# Patient Record
Sex: Male | Born: 1945 | Race: White | Hispanic: No | Smoking: Former smoker
Health system: Southern US, Community
[De-identification: ages and names within clinical notes are randomized; demographics above are authoritative.]

## PROBLEM LIST (undated history)

## (undated) DIAGNOSIS — I10 Essential (primary) hypertension: Secondary | ICD-10-CM

## (undated) DIAGNOSIS — J449 Chronic obstructive pulmonary disease, unspecified: Secondary | ICD-10-CM

## (undated) DIAGNOSIS — Z720 Tobacco use: Secondary | ICD-10-CM

## (undated) DIAGNOSIS — E785 Hyperlipidemia, unspecified: Secondary | ICD-10-CM

## (undated) DIAGNOSIS — T7840XA Allergy, unspecified, initial encounter: Secondary | ICD-10-CM

## (undated) DIAGNOSIS — I714 Abdominal aortic aneurysm, without rupture, unspecified: Secondary | ICD-10-CM

## (undated) HISTORY — DX: Tobacco use: Z72.0

## (undated) HISTORY — DX: Allergy, unspecified, initial encounter: T78.40XA

## (undated) HISTORY — DX: Hyperlipidemia, unspecified: E78.5

## (undated) HISTORY — DX: Chronic obstructive pulmonary disease, unspecified: J44.9

## (undated) HISTORY — PX: ABDOMINAL AORTIC ANEURYSM REPAIR: SUR1152

---
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
## 2011-10-16 HISTORY — PX: ABDOMINAL AORTIC ANEURYSM REPAIR: SHX42

## 2015-10-16 HISTORY — PX: CARDIAC CATHETERIZATION: SHX172

## 2016-10-30 ENCOUNTER — Encounter: Payer: Self-pay | Admitting: Family Medicine

## 2016-10-30 ENCOUNTER — Ambulatory Visit (INDEPENDENT_AMBULATORY_CARE_PROVIDER_SITE_OTHER): Payer: Medicare Other | Admitting: Family Medicine

## 2016-10-30 VITALS — BP 122/64 | HR 86 | Temp 98.1°F | Resp 18 | Ht 69.0 in | Wt 189.1 lb

## 2016-10-30 DIAGNOSIS — Z86010 Personal history of colonic polyps: Secondary | ICD-10-CM | POA: Diagnosis not present

## 2016-10-30 DIAGNOSIS — Z125 Encounter for screening for malignant neoplasm of prostate: Secondary | ICD-10-CM

## 2016-10-30 DIAGNOSIS — Z720 Tobacco use: Secondary | ICD-10-CM | POA: Diagnosis not present

## 2016-10-30 DIAGNOSIS — I714 Abdominal aortic aneurysm, without rupture, unspecified: Secondary | ICD-10-CM

## 2016-10-30 DIAGNOSIS — Z7689 Persons encountering health services in other specified circumstances: Secondary | ICD-10-CM

## 2016-10-30 DIAGNOSIS — E785 Hyperlipidemia, unspecified: Secondary | ICD-10-CM

## 2016-10-30 DIAGNOSIS — Z2821 Immunization not carried out because of patient refusal: Secondary | ICD-10-CM

## 2016-10-30 HISTORY — DX: Tobacco use: Z72.0

## 2016-10-30 NOTE — Progress Notes (Signed)
Chief Complaint  Patient presents with  . Establish Care   Patient is new to establish. Me recently moved here from [place]. Old records are requested. He is an active retired person who feels he is in good health. 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 Likes to be outdoors and exercise. I have discussed the multiple health risks associated with cigarette smoking including, but not limited to, cardiovascular disease, lung disease and cancer.  I have strongly recommended that smoking be stopped.  I have reviewed the various methods of quitting including cold [place], classes, nicotine replacements and prescription medications.  I have offered assistance in this difficult process.  The patient is not interested in assistance at this time. He feels he can quit on his own. He promises he'll be quit by the time he comes back. We specifically discussed the impact cigarette smoking has on vascular disease given his history of aortic aneurysm repair. In [place] he was under the care of cardiology and a vascular surgeon. He states he had a cardiac catheterization within the last 3 months. He thinks it was normal. He states his aorta was checked at the same time and was found to be stable. He was placed on Plavix at that time for unknown reason. I will review his records. Patient is up-to-date with colonoscopy. He gets them every 5 years because of polyps. Next one is due in 2019. He is not having any bleeding or problems with his bowels. He had a tetanus shot 4 years ago. He's had a pneumonia shot. He refuses influenza shots because his wife had a severe reaction to them. He has not had a shingles shot.   Patient Active Problem List   Diagnosis Date Noted  . Tobacco abuse 10/30/2016  . History of colonic polyps 10/30/2016  . AAA (abdominal aortic aneurysm) without rupture ([place]) 10/30/2016  . HLD (hyperlipidemia) 10/30/2016  . Immunization refused 10/30/2016    Outpatient Encounter  Prescriptions as of 10/30/2016  Medication Sig  . aspirin EC 81 MG tablet Take 81 mg by mouth daily.  Marland Kitchen atorvastatin (LIPITOR) 40 MG tablet Take 40 mg by mouth daily.  . cholecalciferol (VITAMIN D) 1000 units tablet Take 5,000 Units by mouth daily.  . clopidogrel (PLAVIX) 75 MG tablet Take 75 mg by mouth daily.   No facility-administered encounter medications on file as of 10/30/2016.     Past Medical History:  Diagnosis Date  . Allergy   . Hyperlipidemia   . Tobacco abuse 10/30/2016    Past Surgical History:  Procedure Laterality Date  . ABDOMINAL AORTIC ANEURYSM REPAIR  2013  . CARDIAC CATHETERIZATION  2017  . TONSILLECTOMY AND ADENOIDECTOMY  1953    Social History   Social History  . Marital status: Married    Spouse name: [name]  . Number of children: 3  . Years of education: 20   Occupational History  . RETIRED     CARPENTER   Social History Main Topics  . Smoking status: Current Every Day Smoker    Packs/day: 1.00    Types: Cigarettes    Start date: 10/15/1966  . Smokeless tobacco: Never Used     Comment: QUITTING  . Alcohol use No  . Drug use: No  . Sexual activity: Not Currently   Other Topics Concern  . Not on file   Social History Narrative   Moved from [place]   Retired Games developer   Lives with [place]    Family History  Problem Relation Age of Onset  . Hypertension Mother   . Cancer Father     MESOTHELIOMA  . Cancer Sister     LUNG  . Cancer Brother     LUNG    Review of Systems  Constitutional: Negative for chills, fever and weight loss.  HENT: Negative for congestion and hearing loss.   Eyes: Negative for blurred vision and pain.  Respiratory: Negative for cough and shortness of breath.   Cardiovascular: Negative for chest pain and leg swelling.  Gastrointestinal: Negative for abdominal pain, constipation, diarrhea and heartburn.  Genitourinary: Negative for dysuria and frequency.  Musculoskeletal: Negative for falls, joint pain and  myalgias.  Neurological: Negative for dizziness, seizures and headaches.  Psychiatric/Behavioral: Negative for depression. The patient is not nervous/anxious and does not have insomnia.     BP 122/64 (BP Location: Right Arm, Patient Position: Sitting, Cuff Size: Normal)   Pulse 86   Temp 98.1 F (36.7 C) (Oral)   Resp 18   Ht 5\' 9"  (1.753 m)   Wt 189 lb 1.9 oz (85.8 kg)   SpO2 95%   BMI 27.93 kg/m   Physical Exam  Constitutional: He is oriented to person, place, and time. He appears well-developed and well-nourished.  Lean, appears in good condition  HENT:  Head: Normocephalic and atraumatic.  Right Ear: External ear normal.  Left Ear: External ear normal.  Nose: Nose normal.  Mouth/Throat: Oropharynx is clear and moist.  Edentulous  Eyes: Conjunctivae and EOM are normal. Pupils are equal, round, and reactive to light.  Neck: Normal range of motion. Neck supple. No thyromegaly present.  Cardiovascular: Normal rate, regular rhythm and normal heart sounds.   Pulmonary/Chest: Effort normal and breath sounds normal. No respiratory distress. He has no wheezes. He has no rales.  Abdominal: Soft. Bowel sounds are normal.  Musculoskeletal: Normal range of motion. He exhibits no edema.  Lymphadenopathy:    He has no cervical adenopathy.  Neurological: He is alert and oriented to person, place, and time.  Gait normal  Skin: Skin is warm and dry.  Psychiatric: He has a normal mood and affect. His behavior is normal. Thought content normal.  Nursing note and vitals reviewed. ASSESSMENT/PLAN:   1. Tobacco abuse Patient is trying to quit  2. History of colonic polyps Colonoscopy due next year  3. AAA (abdominal aortic aneurysm) without rupture ([facility]) Stable  4. Hyperlipidemia, unspecified hyperlipidemia type Stable  5. Encounter to establish care with new doctor   6. Immunization refused    Patient Instructions  Need old records  STOP SMOKING  Need blood work and a  check up in six months       Raylene Everts, MD

## 2016-10-30 NOTE — Patient Instructions (Addendum)
Need old records  STOP SMOKING  Need blood work and a check up in six months

## 2016-11-02 ENCOUNTER — Encounter: Payer: Self-pay | Admitting: Family Medicine

## 2016-11-02 DIAGNOSIS — I2510 Atherosclerotic heart disease of native coronary artery without angina pectoris: Secondary | ICD-10-CM | POA: Insufficient documentation

## 2017-02-21 ENCOUNTER — Other Ambulatory Visit: Payer: Self-pay

## 2017-02-21 MED ORDER — ATORVASTATIN CALCIUM 40 MG PO TABS: 40.0000 mg | ORAL_TABLET | Freq: Every day | ORAL | 3 refills | Status: DC

## 2017-04-29 ENCOUNTER — Encounter: Payer: Medicare Other | Admitting: Family Medicine

## 2017-04-29 ENCOUNTER — Ambulatory Visit: Payer: Medicare Other

## 2017-06-03 ENCOUNTER — Telehealth: Payer: Self-pay

## 2017-06-03 DIAGNOSIS — E785 Hyperlipidemia, unspecified: Secondary | ICD-10-CM | POA: Diagnosis not present

## 2017-06-03 DIAGNOSIS — Z7689 Persons encountering health services in other specified circumstances: Secondary | ICD-10-CM | POA: Diagnosis not present

## 2017-06-03 NOTE — Telephone Encounter (Signed)
Labs ordered.

## 2017-06-04 LAB — LIPID PANEL
CHOL/HDL RATIO: 3.2 ratio (ref <5.0)
Cholesterol: 145 mg/dL (ref <200)
HDL: 45 mg/dL (ref >40)
LDL CALC: 83 mg/dL (ref <100)
Triglycerides: 86 mg/dL (ref <150)
VLDL: 17 mg/dL (ref <30)

## 2017-06-04 LAB — URINALYSIS, ROUTINE W REFLEX MICROSCOPIC
Bilirubin Urine: NEGATIVE
Glucose, UA: NEGATIVE
HGB URINE DIPSTICK: NEGATIVE
KETONES UR: NEGATIVE
Leukocytes, UA: NEGATIVE
NITRITE: NEGATIVE
PH: 5.5 (ref 5.0–8.0)
PROTEIN: NEGATIVE
Specific Gravity, Urine: 1.016 (ref 1.001–1.035)

## 2017-06-04 LAB — COMPLETE METABOLIC PANEL WITH GFR
ALT: 15 U/L (ref 9–46)
AST: 12 U/L (ref 10–35)
Albumin: 4.3 g/dL (ref 3.6–5.1)
Alkaline Phosphatase: 72 U/L (ref 40–115)
BILIRUBIN TOTAL: 1.1 mg/dL (ref 0.2–1.2)
BUN: 16 mg/dL (ref 7–25)
CALCIUM: 9.6 mg/dL (ref 8.6–10.3)
CO2: 22 mmol/L (ref 20–32)
CREATININE: 0.95 mg/dL (ref 0.70–1.18)
Chloride: 104 mmol/L (ref 98–110)
GFR, EST NON AFRICAN AMERICAN: 80 mL/min (ref >=60)
Glucose, Bld: 84 mg/dL (ref 65–99)
Potassium: 5 mmol/L (ref 3.5–5.3)
Sodium: 141 mmol/L (ref 135–146)
TOTAL PROTEIN: 7 g/dL (ref 6.1–8.1)

## 2017-06-04 LAB — VITAMIN D 25 HYDROXY (VIT D DEFICIENCY, FRACTURES): VIT D 25 HYDROXY: 30 ng/mL (ref 30–100)

## 2017-06-04 LAB — HEPATITIS C ANTIBODY: HCV Ab: NONREACTIVE

## 2017-06-04 LAB — PSA: PSA: 2.1 ng/mL (ref <=4.0)

## 2017-06-10 ENCOUNTER — Encounter: Payer: Self-pay | Admitting: Family Medicine

## 2017-06-11 ENCOUNTER — Encounter: Payer: Self-pay | Admitting: Family Medicine

## 2017-06-11 ENCOUNTER — Encounter: Payer: Medicare Other | Admitting: Family Medicine

## 2017-06-11 ENCOUNTER — Ambulatory Visit (INDEPENDENT_AMBULATORY_CARE_PROVIDER_SITE_OTHER): Payer: Medicare Other | Admitting: Family Medicine

## 2017-06-11 VITALS — BP 134/84 | HR 68 | Temp 98.1°F | Resp 18 | Ht 69.0 in | Wt 192.0 lb

## 2017-06-11 DIAGNOSIS — I714 Abdominal aortic aneurysm, without rupture, unspecified: Secondary | ICD-10-CM

## 2017-06-11 DIAGNOSIS — Z720 Tobacco use: Secondary | ICD-10-CM | POA: Diagnosis not present

## 2017-06-11 DIAGNOSIS — E785 Hyperlipidemia, unspecified: Secondary | ICD-10-CM

## 2017-06-11 DIAGNOSIS — I2510 Atherosclerotic heart disease of native coronary artery without angina pectoris: Secondary | ICD-10-CM

## 2017-06-11 MED ORDER — VARENICLINE TARTRATE 1 MG PO TABS: ORAL_TABLET | ORAL | 3 refills | Status: DC

## 2017-06-11 NOTE — Progress Notes (Signed)
Chief Complaint  Patient presents with  . Annual Exam  feels well Has more dyspnea over time, no chest pain or pressure We discussed his smoking Seriously needs to quit Unable on his own.  Discussed methods of quitting, he chooses chantix Labs reviewed, they are all normal or as expected Diet is good, he exercises and walks regularly  Patient Active Problem List   Diagnosis Date Noted  . CAD in native artery 11/02/2016  . Tobacco abuse 10/30/2016  . History of colonic polyps 10/30/2016  . AAA (abdominal aortic aneurysm) without rupture ([place]) 10/30/2016  . Hyperlipidemia 10/30/2016  . Immunization refused 10/30/2016    Outpatient Encounter Prescriptions as of 06/11/2017  Medication Sig  . aspirin EC 81 MG tablet Take 81 mg by mouth daily.  Marland Kitchen atorvastatin (LIPITOR) 40 MG tablet Take 1 tablet (40 mg total) by mouth daily.  . cholecalciferol (VITAMIN D) 1000 units tablet Take 5,000 Units by mouth daily.  . clopidogrel (PLAVIX) 75 MG tablet Take 75 mg by mouth daily.  . varenicline (CHANTIX CONTINUING MONTH PAK) 1 MG tablet One tablet by mouth once to twice a day   No facility-administered encounter medications on file as of 06/11/2017.     Allergies  Allergen Reactions  . Iodine Hives    Review of Systems  Constitutional: Negative for activity change and appetite change.  HENT: Negative for congestion, dental problem and hearing loss.   Eyes: Negative for photophobia and visual disturbance.  Respiratory: Positive for shortness of breath. Negative for wheezing.   Cardiovascular: Negative for chest pain, palpitations and leg swelling.  Gastrointestinal: Negative for constipation and diarrhea.  Genitourinary: Negative for difficulty urinating and frequency.  Musculoskeletal: Negative for arthralgias, back pain and gait problem.  Neurological: Negative for dizziness and headaches.  Psychiatric/Behavioral: Negative for decreased concentration and sleep disturbance.   Vivid dreams    BP 134/84 (BP Location: Left Arm, Patient Position: Sitting, Cuff Size: Normal)   Pulse 68   Temp 98.1 F (36.7 C) (Temporal)   Resp 18   Ht 5\' 9"  (1.753 m)   Wt 192 lb (87.1 kg)   SpO2 94%   BMI 28.35 kg/m   Physical Exam BP 134/84 (BP Location: Left Arm, Patient Position: Sitting, Cuff Size: Normal)   Pulse 68   Temp 98.1 F (36.7 C) (Temporal)   Resp 18   Ht 5\' 9"  (1.753 m)   Wt 192 lb (87.1 kg)   SpO2 94%   BMI 28.35 kg/m   General Appearance:    Alert, cooperative, no distress, appears stated age  Head:    Normocephalic, without obvious abnormality, atraumatic  Eyes:    PERRL, conjunctiva/corneas clear, EOM's intact, fundi    benign, both eyes       Ears:    Normal TM's and external ear canals, both ears  Nose:   Nares normal, septum midline, mucosa normal, no drainage   or sinus tenderness  Throat:   Lips, mucosa, and tongue normal; teeth well restored,and gums normal  Neck:   Supple, symmetrical, trachea midline, no adenopathy;       thyroid:  No enlargement/tenderness/nodules; no carotid   bruit or JVD  Back:     Symmetric, no curvature, ROM normal, no CVA tenderness  Lungs:     Clear to auscultation bilaterally, respirations unlabored  Chest wall:    No tenderness or deformity  Heart:    Regular rate and rhythm, S1 and S2 normal, no murmur, rub  or gallop  Abdomen:     Soft, non-tender, bowel sounds active all four quadrants,    no masses, no organomegaly  Genitalia:    Normal male without lesion, discharge or tenderness  Extremities:   Extremities normal, atraumatic, no cyanosis or edema  Pulses:   2+ and symmetric all extremities  Skin:   Skin color, texture, turgor normal, no rashes or lesions  Lymph nodes:   Cervical, supraclavicular, and axillary nodes normal  Neurologic:   Normal strength, sensation and reflexes      throughout    ASSESSMENT/PLAN:  1. AAA (abdominal aortic aneurysm) without rupture [facility]) Due for surveillance -  Ambulatory referral to Cardiology  2. CAD in native artery asymptomatic - Ambulatory referral to Cardiology  3. Tobacco abuse Long discussion.  Agrees to try chantix.  Risks, benefits and side effects discussed.  4. Hyperlipidemia, unspecified hyperlipidemia type Controlled .  LDL 64 Greater than 50% of this visit was spent in counseling and coordinating care.  Total face to face time:  40 min.  Healthy lifestyle and smoking discussion   Patient Instructions  I am glad you want to quit smoking Take the chantix as directed  I have referred to cardiology  Lab tests were all good  See me yearly Call sooner for problems   Raylene Everts, MD

## 2017-06-11 NOTE — Patient Instructions (Signed)
I am glad you want to quit smoking Take the chantix as directed  I have referred to cardiology  Lab tests were all good  See me yearly Call sooner for problems

## 2017-07-12 ENCOUNTER — Ambulatory Visit (INDEPENDENT_AMBULATORY_CARE_PROVIDER_SITE_OTHER): Payer: Medicare Other | Admitting: Cardiovascular Disease

## 2017-07-12 ENCOUNTER — Encounter: Payer: Self-pay | Admitting: Cardiovascular Disease

## 2017-07-12 VITALS — BP 140/80 | HR 50 | Ht 69.0 in | Wt 193.0 lb

## 2017-07-12 DIAGNOSIS — E785 Hyperlipidemia, unspecified: Secondary | ICD-10-CM | POA: Diagnosis not present

## 2017-07-12 DIAGNOSIS — Z95828 Presence of other vascular implants and grafts: Secondary | ICD-10-CM | POA: Diagnosis not present

## 2017-07-12 DIAGNOSIS — Z720 Tobacco use: Secondary | ICD-10-CM | POA: Diagnosis not present

## 2017-07-12 DIAGNOSIS — I2510 Atherosclerotic heart disease of native coronary artery without angina pectoris: Secondary | ICD-10-CM

## 2017-07-12 NOTE — Patient Instructions (Signed)
Your physician wants you to follow-up in: 6 months with Dr Koneswaran You will receive a reminder letter in the mail two months in advance. If you don't receive a letter, please call our office to schedule the follow-up appointment.    Your physician recommends that you continue on your current medications as directed. Please refer to the Current Medication list given to you today.      If you need a refill on your cardiac medications before your next appointment, please call your pharmacy.      Thank you for choosing [facility] !        

## 2017-07-12 NOTE — Progress Notes (Signed)
CARDIOLOGY CONSULT NOTE  Patient ID: David Rodriguez MRN: [id number] DOB/AGE: March 25, 1946 71 y.o.  Admit date: (Not on file) Primary Physician: Raylene Everts, MD Referring Physician: Dr. Meda Coffee  Reason for Consultation: CAD, AAA  HPI: David Rodriguez is a 71 y.o. male who is being seen today for the evaluation of Coronary artery disease and abdominal aortic aneurysm at the request of Raylene Everts, MD.   He recently saw his PCP, Dr. Meda Coffee, on 06/11/17. I reviewed records, labs, studies.  He reportedly underwent coronary angiography in [place] in 2017. He reportedly also saw vascular surgeon. He has a history of hyperlipidemia and tobacco abuse.  He underwent a nuclear stress test on 10/13/15 in [place], [place], which demonstrated a large inferior wall defect with minimal redistribution, LVEF 58%.  He tells me he underwent coronary angiography in 2017 and does not have any coronary artery disease. This was performed by Dr. Donneta Romberg.  He has a history of an abdominal aortic aneurysm and underwent stent placement by a vascular surgeon by the name of Dr. Nicoletta Ba in [place], [place].  He denies chest pain. He has exertional dyspnea which he attributes to long-standing tobacco use. He smokes a pack of cigarettes daily and has done so for several decades. When he stops smoking for 3 days he felt much better. He just received Chantix in the mail.   He denies palpitations and leg swelling. He also denies claudication pain.  ECG performed in the office today which I ordered and personally interpreted demonstrated normal sinus rhythm with no significant ST segment or T-wave abnormalities, nor any arrhythmias.  Soc Hx: Originally from [place], [place]. He is a retired Games developer. He moved from [place] to [place] and lived there for 24 years and then moved to [place]. He was divorced and has been remarried for 34 years. His wife's daughter is Dolan Amen who lives  in [place]. She has had CVA's.     Allergies  Allergen Reactions  . Iodine Hives    Current Outpatient Prescriptions  Medication Sig Dispense Refill  . aspirin EC 81 MG tablet Take 81 mg by mouth daily.    Marland Kitchen atorvastatin (LIPITOR) 40 MG tablet Take 1 tablet (40 mg total) by mouth daily. 90 tablet 3  . cholecalciferol (VITAMIN D) 1000 units tablet Take 5,000 Units by mouth daily.    . varenicline (CHANTIX CONTINUING MONTH PAK) 1 MG tablet One tablet by mouth once to twice a day 60 tablet 3   No current facility-administered medications for this visit.     Past Medical History:  Diagnosis Date  . Allergy   . Hyperlipidemia   . Tobacco abuse 10/30/2016    Past Surgical History:  Procedure Laterality Date  . ABDOMINAL AORTIC ANEURYSM REPAIR  2013  . CARDIAC CATHETERIZATION  2017  . TONSILLECTOMY AND ADENOIDECTOMY  1953    Social History   Social History  . Marital status: Married    Spouse name: [name]  . Number of children: 3  . Years of education: 87   Occupational History  . RETIRED     CARPENTER   Social History Main Topics  . Smoking status: Current Every Day Smoker    Packs/day: 1.00    Types: Cigarettes    Start date: 10/15/1966  . Smokeless tobacco: Never Used     Comment: QUITTING  . Alcohol use No  . Drug use: No  . Sexual activity: Not Currently   Other  Topics Concern  . Not on file   Social History Narrative   Moved from [place]   Retired Games developer   Lives with [place] Cl[place]     No family history of premature CAD in 1st degree relatives.  Current Meds  Medication Sig  . aspirin EC 81 MG tablet Take 81 mg by mouth daily.  Marland Kitchen atorvastatin (LIPITOR) 40 MG tablet Take 1 tablet (40 mg total) by mouth daily.  . cholecalciferol (VITAMIN D) 1000 units tablet Take 5,000 Units by mouth daily.  . varenicline (CHANTIX CONTINUING MONTH PAK) 1 MG tablet One tablet by mouth once to twice a day      Review of systems complete and found to be negative unless  listed above in HPI    Physical exam Blood pressure 140/80, pulse (!) 50, height 5\' 9"  (1.753 m), weight 193 lb (87.5 kg), SpO2 95 %. General: NAD Neck: No JVD, no thyromegaly or thyroid nodule.  Lungs: Diminished throughout, no crackles or wheezes. CV: Nondisplaced PMI. Bradycardic, regular rhythm, normal S1/S2, no S3/S4, no murmur.  No peripheral edema.  No carotid bruit.    Abdomen: Soft, nontender, no distention.  Skin: Intact without lesions or rashes.  Neurologic: Alert and oriented x 3.  Psych: Normal affect. Extremities: No clubbing or cyanosis.  HEENT: Normal.   ECG: Most recent ECG reviewed.   Labs: Lab Results  Component Value Date/Time   K 5.0 06/03/2017 08:04 AM   BUN 16 06/03/2017 08:04 AM   CREATININE 0.95 06/03/2017 08:04 AM   ALT 15 06/03/2017 08:04 AM     Lipids: Lab Results  Component Value Date/Time   LDLCALC 83 06/03/2017 08:04 AM   CHOL 145 06/03/2017 08:04 AM   TRIG 86 06/03/2017 08:04 AM   HDL 45 06/03/2017 08:04 AM        ASSESSMENT AND PLAN:  1. Coronary artery disease: He denies having any obstructive lesions by cath. I will try and obtain coronary angiography records from [place].  2. Abdominal aortic aneurysm s/p stent: I will obtain records from his vascular surgeon in [place] and then determine the best course for surveillance imaging.  3. Hyperlipidemia: Lipids reviewed above which were performed on 06/03/17. Continue Lipitor 40 mg.  4. Tobacco abuse: Has been prescribed Chantix by PCP.     Disposition: Follow up in 6 months   Signed: Kate Sable, M.D., F.A.C.C.  07/12/2017, 1:34 PM

## 2017-09-09 ENCOUNTER — Encounter: Payer: Self-pay | Admitting: Family Medicine

## 2017-09-09 ENCOUNTER — Telehealth: Payer: Self-pay | Admitting: *Deleted

## 2017-09-09 ENCOUNTER — Other Ambulatory Visit: Payer: Self-pay

## 2017-09-09 ENCOUNTER — Ambulatory Visit: Payer: Medicare Other | Admitting: Family Medicine

## 2017-09-09 VITALS — BP 152/90 | HR 88 | Temp 98.6°F | Resp 20 | Ht 69.0 in | Wt 186.0 lb

## 2017-09-09 DIAGNOSIS — J209 Acute bronchitis, unspecified: Secondary | ICD-10-CM | POA: Diagnosis not present

## 2017-09-09 DIAGNOSIS — J181 Lobar pneumonia, unspecified organism: Secondary | ICD-10-CM | POA: Diagnosis not present

## 2017-09-09 DIAGNOSIS — J440 Chronic obstructive pulmonary disease with acute lower respiratory infection: Secondary | ICD-10-CM

## 2017-09-09 DIAGNOSIS — J189 Pneumonia, unspecified organism: Secondary | ICD-10-CM

## 2017-09-09 MED ORDER — BENZONATATE 100 MG PO CAPS: 100.0000 mg | ORAL_CAPSULE | Freq: Two times a day (BID) | ORAL | 0 refills | Status: DC | PRN

## 2017-09-09 MED ORDER — AZITHROMYCIN 250 MG PO TABS: ORAL_TABLET | ORAL | 0 refills | Status: DC

## 2017-09-09 NOTE — Progress Notes (Signed)
Chief Complaint  Patient presents with  . URI    x 1 week  Patient is here for an acute visit.  He called this morning with shortness of breath, cough, feeling sick and needing to be seen today.  He states he has been having cough, shortness of breath, sputum, fatigue, chills for about 2 weeks.  His wife has viral bronchitis.  He thought was viral bronchitis at first.  He starting to worry about increased purulent sputum.  He has underlying COPD.  He continues to smoke.  He is worried about pneumonia.  He is using inhalers.  No dizziness.  No chest pain.  Patient Active Problem List   Diagnosis Date Noted  . CAD in native artery 11/02/2016  . Tobacco abuse 10/30/2016  . History of colonic polyps 10/30/2016  . AAA (abdominal aortic aneurysm) without rupture ([place]) 10/30/2016  . Hyperlipidemia 10/30/2016  . Immunization refused 10/30/2016    Outpatient Encounter Medications as of 09/09/2017  Medication Sig  . aspirin EC 81 MG tablet Take 81 mg by mouth daily.  Marland Kitchen atorvastatin (LIPITOR) 40 MG tablet Take 1 tablet (40 mg total) by mouth daily.  . cholecalciferol (VITAMIN D) 1000 units tablet Take 5,000 Units by mouth daily.  Marland Kitchen azithromycin (ZITHROMAX) 250 MG tablet Take as directed  . benzonatate (TESSALON) 100 MG capsule Take 1 capsule (100 mg total) by mouth 2 (two) times daily as needed for cough.  . [DISCONTINUED] varenicline (CHANTIX CONTINUING MONTH PAK) 1 MG tablet One tablet by mouth once to twice a day (Patient not taking: Reported on 09/09/2017)   No facility-administered encounter medications on file as of 09/09/2017.     Allergies  Allergen Reactions  . Iodine Hives    Review of Systems  Constitutional: Positive for chills and fatigue. Negative for activity change and appetite change.  HENT: Negative for congestion, dental problem and hearing loss.   Eyes: Negative for photophobia and visual disturbance.  Respiratory: Positive for cough and shortness of breath.  Negative for wheezing.   Cardiovascular: Negative for chest pain, palpitations and leg swelling.  Gastrointestinal: Negative for constipation and diarrhea.  Genitourinary: Negative for difficulty urinating and frequency.  Musculoskeletal: Negative for arthralgias, back pain and gait problem.  Neurological: Negative for dizziness and headaches.  Psychiatric/Behavioral: Negative for decreased concentration and sleep disturbance.    BP (!) 152/90 (BP Location: Left Arm, Patient Position: Sitting, Cuff Size: Normal)   Pulse 88   Temp 98.6 F (37 C) (Temporal)   Resp 20   Ht 5\' 9"  (1.753 m)   Wt 186 lb 0.6 oz (84.4 kg)   SpO2 90%   BMI 27.47 kg/m   Physical Exam  Constitutional: He is oriented to person, place, and time. He appears well-developed and well-nourished.  Appears tired  HENT:  Head: Normocephalic and atraumatic.  Right Ear: External ear normal.  Left Ear: External ear normal.  Mouth/Throat: Oropharynx is clear and moist.  Eyes: Conjunctivae are normal. Pupils are equal, round, and reactive to light.  Neck: Normal range of motion. Neck supple. No thyromegaly present.  Cardiovascular: Normal rate, regular rhythm and normal heart sounds.  Pulmonary/Chest: Effort normal. No respiratory distress. He has wheezes. He has rales.  Few scattered wheeze.  Rales at both bases.  Abdominal: Soft. Bowel sounds are normal.  Musculoskeletal: Normal range of motion. He exhibits no edema.  Lymphadenopathy:    He has no cervical adenopathy.  Neurological: He is alert and oriented to person, place, and  time.  Gait normal  Skin: Skin is warm and dry.  Psychiatric: He has a normal mood and affect. His behavior is normal. Thought content normal.  Nursing note and vitals reviewed.   ASSESSMENT/PLAN:  1. Acute bronchitis with COPD ([place]) Likely has viral bronchitis from his wife.  Symptomatic care for bronchitis was discussed.  I am concerned with his underlying COPD, worsening symptoms,  and rales that he might have some early pneumonia.  I am going to cover him with a Z-Pak.  2. Community acquired pneumonia of right lower lobe of lung [facility])    Patient Instructions  [facility] Take the antibiotic as directed Take the cough medicine 2-3 times a day (Tessalon pill) In addition take Delsym long-acting cough syrup (over-the-counter) 1 teaspoon morning and night Push fluids Rest Call if not feeling better by next week   Raylene Everts, MD

## 2017-09-09 NOTE — Patient Instructions (Signed)
Your prescriptions were [facility] Take the antibiotic as directed Take the cough medicine 2-3 times a day (Tessalon pill) In addition take Delsym long-acting cough syrup (over-the-counter) 1 teaspoon morning and night Push fluids Rest Call if not feeling better by next week

## 2017-09-09 NOTE — Telephone Encounter (Signed)
Patient is aware to be here today at 12:45

## 2017-09-09 NOTE — Telephone Encounter (Signed)
We can work him in this afternoon, make him aware he may have to wait.

## 2017-09-09 NOTE — Telephone Encounter (Signed)
Patient's wife called stating patient has been sick for 2 weeks with a bad cough and coughing up a lot of fleam. When can this patient be scheduled? Patient wants to be seen today. Please advise [phone]

## 2017-09-18 ENCOUNTER — Encounter: Payer: Self-pay | Admitting: Family Medicine

## 2017-10-23 DIAGNOSIS — H52203 Unspecified astigmatism, bilateral: Secondary | ICD-10-CM | POA: Diagnosis not present

## 2017-10-23 DIAGNOSIS — H5203 Hypermetropia, bilateral: Secondary | ICD-10-CM | POA: Diagnosis not present

## 2017-10-23 DIAGNOSIS — H524 Presbyopia: Secondary | ICD-10-CM | POA: Diagnosis not present

## 2017-10-23 DIAGNOSIS — H2513 Age-related nuclear cataract, bilateral: Secondary | ICD-10-CM | POA: Diagnosis not present

## 2017-11-04 DIAGNOSIS — Z1283 Encounter for screening for malignant neoplasm of skin: Secondary | ICD-10-CM | POA: Diagnosis not present

## 2017-11-04 DIAGNOSIS — D225 Melanocytic nevi of trunk: Secondary | ICD-10-CM | POA: Diagnosis not present

## 2017-12-13 ENCOUNTER — Ambulatory Visit (INDEPENDENT_AMBULATORY_CARE_PROVIDER_SITE_OTHER): Payer: Medicare Other | Admitting: Family Medicine

## 2017-12-13 ENCOUNTER — Other Ambulatory Visit: Payer: Self-pay

## 2017-12-13 ENCOUNTER — Ambulatory Visit (HOSPITAL_COMMUNITY)
Admission: RE | Admit: 2017-12-13 | Discharge: 2017-12-13 | Disposition: A | Discharge status: Home / Self Care | Payer: Medicare Other | Source: Ambulatory Visit | Attending: Family Medicine | Admitting: Family Medicine

## 2017-12-13 ENCOUNTER — Encounter: Payer: Self-pay | Admitting: Family Medicine

## 2017-12-13 VITALS — BP 128/60 | HR 77 | Temp 98.9°F | Resp 16 | Ht 69.0 in | Wt 205.0 lb

## 2017-12-13 DIAGNOSIS — R9389 Abnormal findings on diagnostic imaging of other specified body structures: Secondary | ICD-10-CM

## 2017-12-13 DIAGNOSIS — R0609 Other forms of dyspnea: Secondary | ICD-10-CM | POA: Insufficient documentation

## 2017-12-13 DIAGNOSIS — R918 Other nonspecific abnormal finding of lung field: Secondary | ICD-10-CM | POA: Diagnosis not present

## 2017-12-13 DIAGNOSIS — I2510 Atherosclerotic heart disease of native coronary artery without angina pectoris: Secondary | ICD-10-CM

## 2017-12-13 DIAGNOSIS — J189 Pneumonia, unspecified organism: Secondary | ICD-10-CM | POA: Diagnosis not present

## 2017-12-13 LAB — CBC WITH DIFFERENTIAL/PLATELET
BASOS PCT: 0.7 %
Basophils Absolute: 50 cells/uL (ref 0–200)
EOS PCT: 1.5 %
Eosinophils Absolute: 107 cells/uL (ref 15–500)
HCT: 46.6 % (ref 38.5–50.0)
Hemoglobin: 15.7 g/dL (ref 13.2–17.1)
Lymphs Abs: 1342 cells/uL (ref 850–3900)
MCH: 29.8 pg (ref 27.0–33.0)
MCHC: 33.7 g/dL (ref 32.0–36.0)
MCV: 88.4 fL (ref 80.0–100.0)
MONOS PCT: 11.3 %
MPV: 8.7 fL (ref 7.5–12.5)
Neutro Abs: 4800 cells/uL (ref 1500–7800)
Neutrophils Relative %: 67.6 %
PLATELETS: 276 10*3/uL (ref 140–400)
RBC: 5.27 10*6/uL (ref 4.20–5.80)
RDW: 12.7 % (ref 11.0–15.0)
TOTAL LYMPHOCYTE: 18.9 %
WBC mixed population: 802 cells/uL (ref 200–950)
WBC: 7.1 10*3/uL (ref 3.8–10.8)

## 2017-12-13 LAB — COMPLETE METABOLIC PANEL WITH GFR
AG Ratio: 1.4 (calc) (ref 1.0–2.5)
ALT: 14 U/L (ref 9–46)
AST: 12 U/L (ref 10–35)
Albumin: 4 g/dL (ref 3.6–5.1)
Alkaline phosphatase (APISO): 77 U/L (ref 40–115)
BUN: 16 mg/dL (ref 7–25)
CHLORIDE: 105 mmol/L (ref 98–110)
CO2: 31 mmol/L (ref 20–32)
Calcium: 9.1 mg/dL (ref 8.6–10.3)
Creat: 0.98 mg/dL (ref 0.70–1.18)
GFR, EST AFRICAN AMERICAN: 88 mL/min/{1.73_m2} (ref >=60)
GFR, EST NON AFRICAN AMERICAN: 76 mL/min/{1.73_m2} (ref >=60)
GLUCOSE: 87 mg/dL (ref 65–139)
Globulin: 2.8 g/dL (calc) (ref 1.9–3.7)
Potassium: 4.7 mmol/L (ref 3.5–5.3)
Sodium: 141 mmol/L (ref 135–146)
TOTAL PROTEIN: 6.8 g/dL (ref 6.1–8.1)
Total Bilirubin: 0.7 mg/dL (ref 0.2–1.2)

## 2017-12-13 LAB — BRAIN NATRIURETIC PEPTIDE: BRAIN NATRIURETIC PEPTIDE: 15 pg/mL (ref <100)

## 2017-12-13 MED ORDER — PREDNISONE 20 MG PO TABS: 20.0000 mg | ORAL_TABLET | Freq: Two times a day (BID) | ORAL | 0 refills | Status: DC

## 2017-12-13 MED ORDER — LEVOFLOXACIN 500 MG PO TABS: 500.0000 mg | ORAL_TABLET | Freq: Every day | ORAL | 0 refills | Status: DC

## 2017-12-13 NOTE — Patient Instructions (Signed)
Chest x ray now Blood work now Leave me a number to reach you later

## 2017-12-13 NOTE — Progress Notes (Signed)
Chief Complaint  Patient presents with  . Breathing Problem    patient states this has been going for several months, has gotten worse over last few months.  here for dyspnea.  More and more over a couple of months.  He never recovered from the COPD/CAD episode I treated him for in November. He felt initially much better, and thought he was well, but was slow to get his "wind back" .  He STOPPED SMOKING, and is congratulated.  He is very short of breath even going up one flight of stairs.  He tried his wife's albuterol and it did not help. He coughs in the morning, not as much as he used to.  Very little sputum.  No fatigue or fever or chills.  Appetite and weight stable. He has gotten a feeling of an elephant on his chest a couple of times when in bed - not with exertion.  Doesn't feel like angina.   Patient Active Problem List   Diagnosis Date Noted  . CAD in native artery 11/02/2016  . Tobacco abuse 10/30/2016  . History of colonic polyps 10/30/2016  . AAA (abdominal aortic aneurysm) without rupture ([place]) 10/30/2016  . Hyperlipidemia 10/30/2016  . Immunization refused 10/30/2016    Outpatient Encounter Medications as of 12/13/2017  Medication Sig  . aspirin EC 81 MG tablet Take 81 mg by mouth daily.  Marland Kitchen atorvastatin (LIPITOR) 40 MG tablet Take 1 tablet (40 mg total) by mouth daily.  . cholecalciferol (VITAMIN D) 1000 units tablet Take 5,000 Units by mouth daily.  Marland Kitchen levofloxacin (LEVAQUIN) 500 MG tablet Take 1 tablet (500 mg total) by mouth daily.  . predniSONE (DELTASONE) 20 MG tablet Take 1 tablet (20 mg total) by mouth 2 (two) times daily with a meal.  . [DISCONTINUED] azithromycin (ZITHROMAX) 250 MG tablet Take as directed (Patient not taking: Reported on 12/13/2017)  . [DISCONTINUED] benzonatate (TESSALON) 100 MG capsule Take 1 capsule (100 mg total) by mouth 2 (two) times daily as needed for cough. (Patient not taking: Reported on 12/13/2017)   No facility-administered encounter  medications on file as of 12/13/2017.     Allergies  Allergen Reactions  . Iodine Hives    Review of Systems  Constitutional: Negative for activity change, appetite change, chills, fatigue, fever and unexpected weight change.  HENT: Negative for congestion, postnasal drip and rhinorrhea.   Eyes: Negative for redness and visual disturbance.  Respiratory: Positive for cough, chest tightness and shortness of breath.   Cardiovascular: Negative for chest pain and palpitations.  Gastrointestinal: Negative for abdominal distention and abdominal pain.  Genitourinary: Negative for dysuria and frequency.  Musculoskeletal: Negative for arthralgias and myalgias.  Neurological: Negative for light-headedness and headaches.  Psychiatric/Behavioral: Positive for sleep disturbance. The patient is not nervous/anxious.     BP 128/60 (BP Location: Left Arm, Patient Position: Sitting, Cuff Size: Normal)   Pulse 77   Temp 98.9 F (37.2 C) (Temporal)   Resp 16   Ht 5\' 9"  (1.753 m)   Wt 205 lb (93 kg)   SpO2 92%   BMI 30.27 kg/m   Physical Exam  Constitutional: He is oriented to person, place, and time. He appears well-developed and well-nourished.  Very pleasant and good historian  HENT:  Head: Normocephalic and atraumatic.  Right Ear: External ear normal.  Left Ear: External ear normal.  Mouth/Throat: Oropharynx is clear and moist.  moist  Eyes: Conjunctivae are normal. Pupils are equal, round, and reactive to light.  Neck:  Normal range of motion. Neck supple. No JVD present.  Cardiovascular: Normal rate, regular rhythm and normal heart sounds.  EGK normal  Pulmonary/Chest: Effort normal. He has rales.  Bibasilar rales 1/3 way up chest  Abdominal: Soft. There is no tenderness.  Musculoskeletal: Normal range of motion. He exhibits no edema.  No edema  Neurological: He is alert and oriented to person, place, and time.  Psychiatric: He has a normal mood and affect. His behavior is normal.     ASSESSMENT/PLAN:  1. CAD in native artery - EKG 12-Lead - B Nat Peptide   2. Dyspnea on exertion - DG Chest 2 View; Future  - COMPLETE METABOLIC PANEL WITH GFR - CBC with Differential/Platelet - B Nat Peptide - CBC with Differential - COMPLETE METABOLIC PANEL WITH GFR - Ambulatory referral to Pulmonology  3. Abnormal chest x-ray Density in the LLL that could represent pneumonia,but has a normal white count and no fever or sputum.  He was treated with azithromycin in November with incomplete resolution of Sx.  I will treat with 7 d of Levaquin and a burst of prednisone for the COPD.  Will see next week.  Will refer to pulmonary ASAP.  Have underlying concern for another process, such as malignancy, and this will require close follow up. - Ambulatory referral to Pulmonology  Patient was called at home with above information.  Patient Instructions  Chest x ray now Blood work now Leave me a number to reach you later   Raylene Everts, MD

## 2017-12-16 ENCOUNTER — Telehealth: Payer: Self-pay | Admitting: Family Medicine

## 2017-12-16 NOTE — Telephone Encounter (Signed)
Placed a call this morning to patient to see how he is feeling.  He is much improved on the Levaquin and prednisone.  Will follow

## 2017-12-25 ENCOUNTER — Ambulatory Visit: Payer: Medicare Other | Admitting: Internal Medicine

## 2017-12-25 ENCOUNTER — Encounter: Payer: Self-pay | Admitting: Internal Medicine

## 2017-12-25 VITALS — BP 128/72 | HR 81 | Ht 69.0 in | Wt 200.0 lb

## 2017-12-25 DIAGNOSIS — J441 Chronic obstructive pulmonary disease with (acute) exacerbation: Secondary | ICD-10-CM | POA: Insufficient documentation

## 2017-12-25 DIAGNOSIS — J449 Chronic obstructive pulmonary disease, unspecified: Secondary | ICD-10-CM

## 2017-12-25 DIAGNOSIS — R918 Other nonspecific abnormal finding of lung field: Secondary | ICD-10-CM | POA: Diagnosis not present

## 2017-12-25 HISTORY — DX: Chronic obstructive pulmonary disease, unspecified: J44.9

## 2017-12-25 MED ORDER — UMECLIDINIUM-VILANTEROL 62.5-25 MCG/INH IN AEPB: 1.0000 | INHALATION_SPRAY | Freq: Every day | RESPIRATORY_TRACT | 0 refills | Status: DC

## 2017-12-25 MED ORDER — UMECLIDINIUM-VILANTEROL 62.5-25 MCG/INH IN AEPB: 1.0000 | INHALATION_SPRAY | Freq: Every day | RESPIRATORY_TRACT | 11 refills | Status: DC

## 2017-12-25 NOTE — Progress Notes (Signed)
Subjective:     Patient ID: David Rodriguez, male   DOB: 08-24-1946,   MRN: [id number]  HPI  34 yowm with h/o IHD quit smoking  08/2017 with onset doe x xmas 2017  while in [place] > moved to [place] of 11/2016 due to daughter there and since then sob  worse s assoc cough but assoc with chest tightness "when over does it" so referred to cardiology (Dr Raliegh Ip) and to [facility] clinic 12/25/2017 by Dr Blanchie Serve    12/25/2017 1st [facility] office visit/ Abbi Mancini   Chief Complaint  Patient presents with  . Pulmonary Consult    Referred by Dr. Meda Coffee. Pt c/o DOE x 6 months- gets winded walking up an incline. He also has some non prod cough, tends to be worse in the am's.   cough is less since stopped smoking esp the am cough but breathing is worse  Doe MMRC1 = can walk nl pace, flat grade, can't hurry or go uphills or steps s sob  / rarely with the assoc gen ant  chest tightness always better within a few min of stopping and not assoc  n or V or diaphoresis, does have occ HB s tightness. Up hill to mb s stopping x 50 flt slt incline.   No obvious day to day or daytime variability or assoc excess/ purulent sputum or mucus plugs or hemoptysis  subjective wheeze or overt sinus symptoms. No unusual exposure hx or h/o childhood pna/ asthma or knowledge of premature birth.  Sleeping ok flat without nocturnal  or early am exacerbation  of respiratory  c/o's or need for noct saba. Also denies any obvious fluctuation of symptoms with weather or environmental changes or other aggravating or alleviating factors except as outlined above   Current Allergies, Complete Past Medical History, Past Surgical History, Family History, and Social History were reviewed in [employer] record.  ROS  The following are not active complaints unless bolded Hoarseness, sore throat, dysphagia, dental problems, itching, sneezing,  nasal congestion or discharge of excess mucus or purulent secretions, ear  ache,   fever, chills, sweats, unintended wt loss or wt gain, classically pleuritic cp,  orthopnea pnd or leg swelling, presyncope, palpitations, abdominal pain, anorexia, nausea, vomiting, diarrhea  or change in bowel habits or change in bladder habits, change in stools or change in urine, dysuria, hematuria,  rash, arthralgias, visual complaints, headache, numbness, weakness or ataxia or problems with walking or coordination,  change in mood/affect or memory.        Current Meds  Medication Sig  . aspirin EC 81 MG tablet Take 81 mg by mouth daily.  Marland Kitchen atorvastatin (LIPITOR) 40 MG tablet Take 1 tablet (40 mg total) by mouth daily.  . cholecalciferol (VITAMIN D) 1000 units tablet Take 5,000 Units by mouth daily.           Review of Systems     Objective:   Physical Exam    amb wm slt hoarse/ partial dentures    Wt Readings from Last 3 Encounters:  12/25/17 200 lb (90.7 kg)  12/13/17 205 lb (93 kg)  09/09/17 186 lb 0.6 oz (84.4 kg)     Vital signs reviewed - Note on arrival 02 sats  94% on RA     HEENT: nl dentition, turbinates bilaterally, and oropharynx. Nl external ear canals without cough reflex   NECK :  without JVD/Nodes/TM/ nl carotid upstrokes bilaterally   LUNGS: no acc muscle use,   Mild  barrel contour chest wall with bilateral  Distant bs without cough on insp or exp maneuver and mild/mod   Hyperresonant  to  percussion bilaterally     CV:  RRR  no s3 or murmur or increase in P2, and no edema   ABD:  soft and nontender with nl inspiratory excursion in the supine position. No bruits or organomegaly appreciated, bowel sounds nl  MS:  Nl gait/ ext warm without deformities, calf tenderness, cyanosis or clubbing No obvious joint restrictions   SKIN: warm and dry without lesions    NEURO:  alert, approp, nl sensorium with  no motor or cerebellar deficits apparent.      I personally reviewed images and agree with radiology impression as follows:  CXR:    12/13/17 The heart size and mediastinal contours are within normal limits. On the lateral radiograph there is a opacity, likely in the left lower lobe which may represent pneumonia. Coarsened interstitial markings noted throughout both lungs suggestive of emphysema. Spondylosis noted within the thoracic spine.      Assessment:

## 2017-12-25 NOTE — Patient Instructions (Addendum)
Anoro one click each am, take two deep drags   Please schedule a follow up office visit in 6 weeks, call sooner if needed with PFTs on return and cxr

## 2017-12-26 ENCOUNTER — Encounter: Payer: Self-pay | Admitting: Internal Medicine

## 2017-12-26 DIAGNOSIS — R918 Other nonspecific abnormal finding of lung field: Secondary | ICD-10-CM | POA: Insufficient documentation

## 2017-12-26 NOTE — Assessment & Plan Note (Signed)
Quit smoking 08/2017 - Spirometry 12/25/2017  FEV1  1.37 (45%)  Ratio 58  With classic curvature with no rx prior - 12/25/2017  After extensive coaching inhaler device  effectiveness =    90% DPI / elipta > try anoro    Pt is Group B in terms of symptom/risk and laba/lama therefore appropriate rx at this point and reasonable to start here with anoro, esp since cough /asthmatic component not obvious features   Some of his doe since stopped smoking may be attributable to wt gain but also concerned he may be developing recurrent but relatively stable form of angina (vs gerd assoc with ex) and f/u with cards planned, Advised to notify Dr Raliegh Ip at once if pattern of chest tightness worsens, esp once he starts anoro regularly and this allows him to do more.   Total time devoted to counseling  > 50 % of initial 60 min office visit:  review case with pt/wife  discussion of options/alternatives/ personally creating written customized instructions  in presence of pt  then going over those specific  Instructions directly with the pt including how to use all of the meds but in particular covering each new medication in detail and the difference between the maintenance= "automatic" meds and the prns using an action plan format for the latter (If this problem/symptom => do that organization reading Left to right).  Please see AVS from this visit for a full list of these instructions which I personally wrote for this pt and  are unique to this visit.

## 2017-12-26 NOTE — Assessment & Plan Note (Signed)
This density can be seen on PA and lat view and looks to me c/w osteophyte but not sure about it and will repeat cxr on return visit and if still concern then ct.  Discussed in detail all the  indications, usual  risks and alternatives  relative to the benefits with patient/ wife who agree  to proceed with w/u as outlined.  Also discussed Low-dose CT lung cancer screening is recommended for patients who are 59-74 years of age with a 30+ pack-year history of smoking, and who are currently smoking or quit <=15 years ago.   At this point declined starting routine screening but will discuss again at next ov in 6 weeks

## 2017-12-30 ENCOUNTER — Other Ambulatory Visit: Payer: Self-pay | Admitting: Internal Medicine

## 2017-12-30 MED ORDER — UMECLIDINIUM-VILANTEROL 62.5-25 MCG/INH IN AEPB: 1.0000 | INHALATION_SPRAY | Freq: Every day | RESPIRATORY_TRACT | 3 refills | Status: DC

## 2018-01-01 DIAGNOSIS — M9902 Segmental and somatic dysfunction of thoracic region: Secondary | ICD-10-CM | POA: Diagnosis not present

## 2018-01-01 DIAGNOSIS — M545 Low back pain: Secondary | ICD-10-CM | POA: Diagnosis not present

## 2018-01-01 DIAGNOSIS — M9903 Segmental and somatic dysfunction of lumbar region: Secondary | ICD-10-CM | POA: Diagnosis not present

## 2018-01-01 DIAGNOSIS — M9905 Segmental and somatic dysfunction of pelvic region: Secondary | ICD-10-CM | POA: Diagnosis not present

## 2018-01-03 DIAGNOSIS — M9903 Segmental and somatic dysfunction of lumbar region: Secondary | ICD-10-CM | POA: Diagnosis not present

## 2018-01-03 DIAGNOSIS — M9905 Segmental and somatic dysfunction of pelvic region: Secondary | ICD-10-CM | POA: Diagnosis not present

## 2018-01-03 DIAGNOSIS — M545 Low back pain: Secondary | ICD-10-CM | POA: Diagnosis not present

## 2018-01-03 DIAGNOSIS — M9902 Segmental and somatic dysfunction of thoracic region: Secondary | ICD-10-CM | POA: Diagnosis not present

## 2018-01-08 ENCOUNTER — Encounter: Payer: Self-pay | Admitting: Cardiovascular Disease

## 2018-01-08 ENCOUNTER — Ambulatory Visit: Payer: Medicare Other | Admitting: Cardiovascular Disease

## 2018-01-08 VITALS — BP 136/80 | HR 80 | Ht 69.0 in | Wt 203.8 lb

## 2018-01-08 DIAGNOSIS — M9902 Segmental and somatic dysfunction of thoracic region: Secondary | ICD-10-CM | POA: Diagnosis not present

## 2018-01-08 DIAGNOSIS — Z95828 Presence of other vascular implants and grafts: Secondary | ICD-10-CM

## 2018-01-08 DIAGNOSIS — E785 Hyperlipidemia, unspecified: Secondary | ICD-10-CM

## 2018-01-08 DIAGNOSIS — J449 Chronic obstructive pulmonary disease, unspecified: Secondary | ICD-10-CM | POA: Diagnosis not present

## 2018-01-08 DIAGNOSIS — F17201 Nicotine dependence, unspecified, in remission: Secondary | ICD-10-CM | POA: Diagnosis not present

## 2018-01-08 DIAGNOSIS — M545 Low back pain: Secondary | ICD-10-CM | POA: Diagnosis not present

## 2018-01-08 DIAGNOSIS — M9903 Segmental and somatic dysfunction of lumbar region: Secondary | ICD-10-CM | POA: Diagnosis not present

## 2018-01-08 DIAGNOSIS — M9905 Segmental and somatic dysfunction of pelvic region: Secondary | ICD-10-CM | POA: Diagnosis not present

## 2018-01-08 NOTE — Progress Notes (Signed)
SUBJECTIVE: The patient presents for routine follow-up. In summary, he reportedly underwent coronary angiography in [place] in 2017. He reportedly also saw vascular surgeon. He has a history of hyperlipidemia and tobacco abuse.  He underwent a nuclear stress test on 10/13/15 in [place], [place], which demonstrated a large inferior wall defect with minimal redistribution, LVEF 58%.  He tells me he underwent coronary angiography in 2017 and does not have any coronary artery disease. This was performed by Dr. Donneta Romberg.  He has a history of an abdominal aortic aneurysm and underwent stent placement by a vascular surgeon by the name of Dr. Nicoletta Ba in [place], [place].  I reviewed an echocardiogram report from January 2013 which demonstrated normal left ventricular systolic function, LVEF 16-10%, mild LVH, and diastolic dysfunction.  He is doing well.  He quit smoking on February 12.  He now follows with pulmonary and was started on Anoro Ellipta.  He denies chest pain and palpitations.  With respect to imaging of his abdominal aorta, iodinated contrast previously caused him to break out in hives.    Soc Hx: Originally from [place], [place]. He is a retired Games developer. He moved from [place] to [place] and lived there for 24 years and then moved to [place]. He was divorced and has been remarried for 34 years. His wife's daughter is Dolan Amen who lives in [place]. She has had CVA's.  Review of Systems: As per "subjective", otherwise negative.  Allergies  Allergen Reactions  . Iodine Hives    Current Outpatient Medications  Medication Sig Dispense Refill  . aspirin EC 81 MG tablet Take 81 mg by mouth daily.    Marland Kitchen atorvastatin (LIPITOR) 40 MG tablet Take 1 tablet (40 mg total) by mouth daily. 90 tablet 3  . cholecalciferol (VITAMIN D) 1000 units tablet Take 5,000 Units by mouth daily.    Marland Kitchen umeclidinium-vilanterol (ANORO ELLIPTA) 62.5-25 MCG/INH AEPB Inhale  1 puff into the lungs daily. 180 each 3   No current facility-administered medications for this visit.     Past Medical History:  Diagnosis Date  . Allergy   . 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 Hyperlipidemia   . Tobacco abuse 10/30/2016    Past Surgical History:  Procedure Laterality Date  . ABDOMINAL AORTIC ANEURYSM REPAIR  2013  . CARDIAC CATHETERIZATION  2017  . TONSILLECTOMY AND ADENOIDECTOMY  1953    Social History   Socioeconomic History  . Marital status: Married    Spouse name: [name]  . Number of children: 3  . Years of education: 74  . Highest education level: Not on file  Occupational History  . Occupation: RETIRED    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  Social Needs  . Financial resource strain: Not on file  . Food insecurity:    Worry: Not on file    Inability: Not on file  . Transportation needs:    Medical: Not on file    Non-medical: Not on file  Tobacco Use  . Smoking status: Former Smoker    Packs/day: 1.00    Years: 50.00    Pack years: 50.00    Types: Cigarettes    Last attempt to quit: 08/26/2017    Years since quitting: 0.3  . Smokeless tobacco: Never Used  Substance and Sexual Activity  . Alcohol use: No  . Drug use: No  . Sexual activity: Not Currently  Lifestyle  . Physical activity:    Days per week: Not on file  Minutes per session: Not on file  . Stress: Not on file  Relationships  . Social connections:    Talks on phone: Not on file    Gets together: Not on file    Attends religious service: Not on file    Active member of club or organization: Not on file    Attends meetings of clubs or organizations: Not on file    Relationship status: Not on file  . Intimate partner violence:    Fear of current or ex partner: Not on file    Emotionally abused: Not on file    Physically abused: Not on file    Forced sexual activity: Not on file  Other Topics Concern  . Not on file  Social History Narrative   Moved from [place]   Retired  Games developer   Lives with [name]     Vitals:   80/99/83 0951  BP: 136/80  Pulse: 80  SpO2: 94%  Weight: 203 lb 12.8 oz (92.4 kg)  Height: 5\' 9"  (1.753 m)    Wt Readings from Last 3 Encounters:  01/08/18 203 lb 12.8 oz (92.4 kg)  12/25/17 200 lb (90.7 kg)  12/13/17 205 lb (93 kg)     PHYSICAL EXAM General: NAD HEENT: Normal. Neck: No JVD, no thyromegaly. Lungs: Diminished throughout, no crackles or wheezes. CV: Regular rate and rhythm, normal S1/S2, no S3/S4, no murmur. No pretibial or periankle edema.   Abdomen: Soft, nontender, no distention.  Neurologic: Alert and oriented.  Psych: Normal affect. Skin: Normal. Musculoskeletal: No gross deformities.    ECG: Most recent ECG reviewed.   Labs: Lab Results  Component Value Date/Time   K 4.7 12/13/2017 08:57 AM   BUN 16 12/13/2017 08:57 AM   CREATININE 0.98 12/13/2017 08:57 AM   ALT 14 12/13/2017 08:57 AM   HGB 15.7 12/13/2017 08:57 AM     Lipids: Lab Results  Component Value Date/Time   LDLCALC 83 06/03/2017 08:04 AM   CHOL 145 06/03/2017 08:04 AM   TRIG 86 06/03/2017 08:04 AM   HDL 45 06/03/2017 08:04 AM       ASSESSMENT AND PLAN:  1. Coronary artery disease: He denies having any obstructive lesions by cath. I will try and obtain coronary angiography records from [place].  I was able to obtain stress test and echocardiogram reports as noted above. He is symptomatically stable.  2. Abdominal aortic aneurysm s/p stent: With respect to imaging of his abdominal aorta, iodinated contrast previously caused him to break out in hives.  I will make a referral to vascular surgery for further surveillance monitoring.  3. Hyperlipidemia: Lipids previously reviewed and were performed on 06/03/17. Continue Lipitor 40 mg.  4. Tobacco abuse: He quit smoking February 12 and feels much better already.  5.  COPD: He now follows with pulmonary.  He was recently started on Anoro Ellipta.  He quit smoking February 12.   Symptoms appear to be stable.   Disposition: Follow up with vascular surgery. Follow up with me prn.   Kate Sable, M.D., F.A.C.C.

## 2018-01-08 NOTE — Patient Instructions (Signed)
Medication Instructions:  Your physician recommends that you continue on your current medications as directed. Please refer to the Current Medication list given to you today.   Labwork: none  Testing/Procedures: none  Follow-Up: Your physician recommends that you schedule a follow-up appointment in: AS NEEDED     Any Other Special Instructions Will Be Listed Below (If Applicable).  You have been referred to [facility] in [facility]    If you need a refill on your cardiac medications before your next appointment, please call your pharmacy.

## 2018-02-11 ENCOUNTER — Ambulatory Visit (INDEPENDENT_AMBULATORY_CARE_PROVIDER_SITE_OTHER)
Admission: RE | Admit: 2018-02-11 | Discharge: 2018-02-11 | Disposition: A | Discharge status: Home / Self Care | Payer: Medicare Other | Source: Ambulatory Visit | Attending: Internal Medicine | Admitting: Internal Medicine

## 2018-02-11 ENCOUNTER — Ambulatory Visit: Payer: Medicare Other | Admitting: Internal Medicine

## 2018-02-11 ENCOUNTER — Ambulatory Visit (INDEPENDENT_AMBULATORY_CARE_PROVIDER_SITE_OTHER): Payer: Medicare Other | Admitting: Internal Medicine

## 2018-02-11 ENCOUNTER — Encounter: Payer: Self-pay | Admitting: Internal Medicine

## 2018-02-11 ENCOUNTER — Other Ambulatory Visit: Payer: Self-pay

## 2018-02-11 VITALS — BP 128/74 | HR 71 | Ht 69.0 in | Wt 201.0 lb

## 2018-02-11 DIAGNOSIS — R918 Other nonspecific abnormal finding of lung field: Secondary | ICD-10-CM

## 2018-02-11 DIAGNOSIS — J449 Chronic obstructive pulmonary disease, unspecified: Secondary | ICD-10-CM | POA: Diagnosis not present

## 2018-02-11 DIAGNOSIS — I714 Abdominal aortic aneurysm, without rupture, unspecified: Secondary | ICD-10-CM

## 2018-02-11 DIAGNOSIS — J984 Other disorders of lung: Secondary | ICD-10-CM | POA: Diagnosis not present

## 2018-02-11 LAB — PULMONARY FUNCTION TEST
DL/VA % pred: 70 %
DL/VA: 3.21 ml/min/mmHg/L
DLCO UNC % PRED: 66 %
DLCO UNC: 20.52 ml/min/mmHg
DLCO cor % pred: 64 %
DLCO cor: 19.93 ml/min/mmHg
FEF 25-75 PRE: 0.71 L/s
FEF 25-75 Post: 0.86 L/sec
FEF2575-%Change-Post: 19 %
FEF2575-%PRED-POST: 38 %
FEF2575-%Pred-Pre: 32 %
FEV1-%CHANGE-POST: 6 %
FEV1-%PRED-POST: 59 %
FEV1-%PRED-PRE: 55 %
FEV1-PRE: 1.66 L
FEV1-Post: 1.77 L
FEV1FVC-%Change-Post: -1 %
FEV1FVC-%Pred-Pre: 73 %
FEV6-%CHANGE-POST: 7 %
FEV6-%PRED-POST: 82 %
FEV6-%PRED-PRE: 77 %
FEV6-POST: 3.21 L
FEV6-PRE: 3 L
FEV6FVC-%CHANGE-POST: 0 %
FEV6FVC-%PRED-POST: 102 %
FEV6FVC-%PRED-PRE: 102 %
FVC-%CHANGE-POST: 8 %
FVC-%Pred-Post: 81 %
FVC-%Pred-Pre: 75 %
FVC-Post: 3.35 L
FVC-Pre: 3.1 L
POST FEV6/FVC RATIO: 96 %
PRE FEV6/FVC RATIO: 97 %
Post FEV1/FVC ratio: 53 %
Pre FEV1/FVC ratio: 54 %
RV % PRED: 189 %
RV: 4.67 L
TLC % PRED: 123 %
TLC: 8.46 L

## 2018-02-11 NOTE — Progress Notes (Signed)
PFT completed 02/11/18

## 2018-02-11 NOTE — Progress Notes (Signed)
Subjective:     Patient ID: David Rodriguez, male   DOB: 1946/02/24,   MRN: [id number]    Brief patient profile:  60 yowm with h/o IHD quit smoking  08/2017 with onset doe x xmas 2017  while in [place] > moved to [place] of 11/2016 due to daughter there and since then sob  worse s assoc cough but assoc with chest tightness "when over does it" so referred to cardiology (Dr Raliegh Ip) and to [facility] clinic 12/25/2017 by Dr Blanchie Serve   History of Present Illness  12/25/2017 1st [facility] office visit/ David Rodriguez   Chief Complaint  Patient presents with  . Pulmonary Consult    Referred by Dr. Meda Coffee. Pt c/o DOE x 6 months- gets winded walking up an incline. He also has some non prod cough, tends to be worse in the am's.   cough is less since stopped smoking esp the am cough but breathing is worse  Doe MMRC1 = can walk nl pace, flat grade, can't hurry or go uphills or steps s sob  / rarely with the assoc gen ant  chest tightness always better within a few min of stopping and not assoc  n or V or diaphoresis, does have occ HB s tightness. Up hill to mb s stopping x 50 flt slt incline rec Anoro one click each am, take two deep drags   02/11/2018  f/u ov/Tashan Kreitzer re:  GOLD II copd  maint on anoro  Chief Complaint  Patient presents with  . Follow-up    PFT's done today. Breathing is much improved and he is coughing less. He does not have a rescue inhaler.   Dyspnea:  MMRC1 = can walk nl pace, flat grade, can't hurry or go uphills or steps s sob   Cough: none now occ in am  Sleep: ok  SABA : doesn't use  No obvious day to day or daytime variability or assoc excess/ purulent sputum or mucus plugs or hemoptysis or cp or chest tightness, subjective wheeze or overt sinus or hb symptoms. No unusual exposure hx or h/o childhood pna/ asthma or knowledge of premature birth.  Sleeping  Fine flat  without nocturnal  or early am exacerbation  of respiratory  c/o's or need for noct saba. Also denies any obvious  fluctuation of symptoms with weather or environmental changes or other aggravating or alleviating factors except as outlined above   Current Allergies, Complete Past Medical History, Past Surgical History, Family History, and Social History were reviewed in [employer] record.  ROS  The following are not active complaints unless bolded Hoarseness, sore throat, dysphagia, dental problems, itching, sneezing,  nasal congestion or discharge of excess mucus or purulent secretions, ear ache,   fever, chills, sweats, unintended wt loss or wt gain, classically pleuritic or exertional cp,  orthopnea pnd or arm/hand swelling  or leg swelling, presyncope, palpitations, abdominal pain, anorexia, nausea, vomiting, diarrhea  or change in bowel habits or change in bladder habits, change in stools or change in urine, dysuria, hematuria,  rash, arthralgias, visual complaints, headache, numbness, weakness or ataxia or problems with walking or coordination,  change in mood or  memory.        Current Meds  Medication Sig  . aspirin EC 81 MG tablet Take 81 mg by mouth daily.  Marland Kitchen atorvastatin (LIPITOR) 40 MG tablet Take 1 tablet (40 mg total) by mouth daily.  . cholecalciferol (VITAMIN D) 1000 units tablet Take 5,000 Units by mouth daily.  Marland Kitchen  umeclidinium-vilanterol (ANORO ELLIPTA) 62.5-25 MCG/INH AEPB Inhale 1 puff into the lungs daily.                           Objective:   Physical Exam  amb wm nad    02/11/2018       201   12/25/17 200 lb (90.7 kg)  12/13/17 205 lb (93 kg)  09/09/17 186 lb 0.6 oz (84.4 kg)      Vital signs reviewed - Note on arrival 02 sats  92% on RA       HEENT: nl  oropharynx. Nl external ear canals without cough reflex - mild bilateral non-specific turbinate edema  / upper parital dentures    NECK :  without JVD/Nodes/TM/ nl carotid upstrokes bilaterally   LUNGS: no acc muscle use,  Mild barrel  contour chest wall with bilateral  Distant bs s  audible wheeze and  without cough on insp or exp maneuver and mild   Hyperresonant  to  percussion bilaterally     CV:  RRR  no s3 or murmur or increase in P2, and no edema   ABD:  soft and nontender with pos late  insp Hoover's  in the supine position. No bruits or organomegaly appreciated, bowel sounds nl  MS:   Nl gait/  ext warm without deformities, calf tenderness, cyanosis or clubbing No obvious joint restrictions   SKIN: warm and dry without lesions    NEURO:  alert, approp, nl sensorium with  no motor or cerebellar deficits apparent.           CXR PA and Lateral:   02/11/2018 :    I personally reviewed images and agree with radiology impression as follows:   1. Persistent density projecting over the lower thoracic spine on the lateral view is attributed to a paraspinal osteophyte. 2. No focal airspace disease or suspected acute findings.    Assessment:

## 2018-02-11 NOTE — Patient Instructions (Addendum)
Please remember to go to the  x-ray department downstairs in the basement  for your tests - we will call you with the results when they are available.      If you are satisfied with your treatment plan,  let your doctor know and he/she can either refill your medications or you can return here when your prescription runs out.     If in any way you are not 100% satisfied,  please tell us.  If 100% better, tell your friends!  Pulmonary follow up is as needed   

## 2018-02-12 ENCOUNTER — Encounter: Payer: Self-pay | Admitting: Internal Medicine

## 2018-02-12 NOTE — Assessment & Plan Note (Signed)
See CXR 12/13/17 ? Osteophyte? > confirmed osteophyte 02/11/2018 > no directed f/u needed   Did advise him re: Low-dose CT lung cancer screening is recommended for patients who are 56-74 years of age with a 30+ pack-year history of smoking, and who are currently smoking or quit <=15 years ago.   Declines screening for now > Follow up per Primary Care planned

## 2018-02-12 NOTE — Assessment & Plan Note (Signed)
Quit smoking 08/2017 - Spirometry 12/25/2017  FEV1  1.37 (45%)  Ratio 58  With classic curvature with no rx prior - 12/25/2017  After extensive coaching inhaler device  effectiveness =    90% DPI / elipta > try anoro  - PFT's  02/11/2018  FEV1 1.77 (59 % ) ratio 53  p 6 % improvement from saba p anoro prior to study with DLCO  66/64c % corrects to 70  % for alv volume     Clearly improved on anoro c/w Pt is Group B in terms of symptom/risk and laba/lama therefore appropriate rx at this point.      If he starts having flares would change to Trelegy   I had an extended final summary discussion with the patient reviewing all relevant studies completed to date and  lasting 15 to 20 minutes of a 25 minute visit on the following issues:   1) - The proper method of use, as well as anticipated side effects, of a metered-dose inhaler are discussed and demonstrated to the patient   2) reviewed the Fletcher curve with the patient that basically indicates  if you quit smoking when your best day FEV1 is still   preserved (as is relatively still   the case here)  it is highly unlikely you will progress to severe disease and informed the patient there was  no medication on the market that has proven to alter the curve/ its downward trajectory  or the likelihood of progression of their disease(unlike other chronic medical conditions such as atheroclerosis where we do think we can change the natural hx with risk reducing meds)    Therefore stopping smoking and maintaining abstinence are  the most important aspects of care, not choice of inhalers or for that matter, doctors.   Treatment other than smoking cessation  is entirely directed by severity of symptoms and focused also on reducing exacerbations, not attempting to change the natural history of the disease.    Pulmonary f/u is prn as prognosis is quit good based on above issues

## 2018-03-14 ENCOUNTER — Encounter: Payer: Self-pay | Admitting: Vascular Surgery

## 2018-03-14 ENCOUNTER — Ambulatory Visit (HOSPITAL_COMMUNITY)
Admission: RE | Admit: 2018-03-14 | Discharge: 2018-03-14 | Disposition: A | Discharge status: Home / Self Care | Payer: Medicare Other | Source: Ambulatory Visit | Attending: Vascular Surgery | Admitting: Vascular Surgery

## 2018-03-14 ENCOUNTER — Other Ambulatory Visit: Payer: Self-pay

## 2018-03-14 ENCOUNTER — Ambulatory Visit: Payer: Medicare Other | Admitting: Vascular Surgery

## 2018-03-14 VITALS — BP 140/86 | HR 73 | Temp 97.5°F | Resp 18 | Ht 69.0 in | Wt 199.0 lb

## 2018-03-14 DIAGNOSIS — I714 Abdominal aortic aneurysm, without rupture, unspecified: Secondary | ICD-10-CM

## 2018-03-14 NOTE — Progress Notes (Signed)
Patient ID: David Rodriguez, male   DOB: [birth year], 72 y.o.   MRN: [id number]  Reason for Consult: AAA   Referred by Herminio Commons, MD  Subjective:     HPI:  David Rodriguez is a 72 y.o. male with history of endovascular aortic aneurysm repair in 2011 in [place].  This was performed with a Gore endovascular device.  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  He does not have back or abdominal pain.  He does not have lower extremity pain.  He is unsure if he is ever had his lower extremities evaluated.  He presents today with aortic duplex for evaluation of his previous stent graft.  He quit smoking 1 year ago.  Past Medical History:  Diagnosis Date  . Allergy   . 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 Hyperlipidemia   . Tobacco abuse 10/30/2016   Family History  Problem Relation Age of Onset  . Hypertension Mother   . Cancer Father        MESOTHELIOMA  . Cancer Sister        LUNG  . Cancer Brother        LUNG   Past Surgical History:  Procedure Laterality Date  . ABDOMINAL AORTIC ANEURYSM REPAIR  2013  . CARDIAC CATHETERIZATION  2017  . TONSILLECTOMY AND ADENOIDECTOMY  1953    Short Social History:  Social History   Tobacco Use  . Smoking status: Former Smoker    Packs/day: 1.00    Years: 50.00    Pack years: 50.00    Types: Cigarettes    Last attempt to quit: 08/26/2017    Years since quitting: 0.5  . Smokeless tobacco: Never Used  Substance Use Topics  . Alcohol use: No    Allergies  Allergen Reactions  . Iodine Hives    Current Outpatient Medications  Medication Sig Dispense Refill  . aspirin EC 81 MG tablet Take 81 mg by mouth daily.    Marland Kitchen atorvastatin (LIPITOR) 40 MG tablet Take 1 tablet (40 mg total) by mouth daily. 90 tablet 3  . cholecalciferol (VITAMIN D) 1000 units tablet Take 5,000 Units by mouth daily.    Marland Kitchen umeclidinium-vilanterol (ANORO ELLIPTA) 62.5-25 MCG/INH AEPB Inhale 1 puff into the  lungs daily. 180 each 3   No current facility-administered medications for this visit.     Review of Systems  Constitutional:  Constitutional negative. HENT: HENT negative.  Eyes: Eyes negative.  Respiratory: Positive for shortness of breath and wheezing.  Cardiovascular: Positive for dyspnea with exertion.  GI: Gastrointestinal negative.  Musculoskeletal: Musculoskeletal negative.  Skin: Skin negative.  Neurological: Neurological negative. Hematologic: Hematologic/lymphatic negative.  Psychiatric: Psychiatric negative.        Objective:  Objective   Vitals:   03/14/18 0913  BP: 140/86  Pulse: 73  Resp: 18  Temp: (!) 97.5 F (36.4 C)  TempSrc: Oral  SpO2: 91%  Weight: 199 lb (90.3 kg)  Height: 5\' 9"  (1.753 m)   Body mass index is 29.39 kg/m.  Physical Exam  Constitutional: He appears well-developed.  HENT:  Head: Normocephalic.  Eyes: Pupils are equal, round, and reactive to light.  Neck: Normal range of motion. Neck supple.  Cardiovascular: Normal rate.  Pulses:      Carotid pulses are 2+ on the right side, and 2+ on the left side.      Radial pulses are 2+ on the right side, and 2+  on the left side.       Popliteal pulses are 3+ on the right side, and 3+ on the left side.       Dorsalis pedis pulses are 2+ on the right side, and 2+ on the left side.  Pulmonary/Chest: Effort normal.  Abdominal: He exhibits no mass.  Musculoskeletal: Normal range of motion. He exhibits no edema.  Neurological: He is alert.  Skin: Skin is warm and dry.  Psychiatric: He has a normal mood and affect. His behavior is normal. Judgment and thought content normal.    Data: I have independently interpreted his abdominal aortic study which demonstrates maximal diameter 4.24 cm in the transverse dimension right common iliac artery measures 1.5 cm.  The left was incompletely visualized.     Assessment/Plan:     72 year old male status post uncomplicated repair of abdominal aortic  aneurysm in 2011 performed in [place].  He presents for establishing follow-up.  States that the size of the aneurysm of the time was about 5 cm and today on exam is 4.24 without any evidence of endoleak.  From the standpoint we will follow him up in 1 year.  He does have very strong pulses at his popliteal arteries bilaterally so we will also check a popliteal duplex at that time.  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  He demonstrates good understanding and we will see him in 1 year.     Waynetta Sandy MD [facility]

## 2018-03-20 ENCOUNTER — Encounter: Payer: Self-pay | Admitting: Family Medicine

## 2018-03-21 ENCOUNTER — Encounter: Payer: Self-pay | Admitting: Family Medicine

## 2018-03-24 ENCOUNTER — Other Ambulatory Visit: Payer: Self-pay | Admitting: Family Medicine

## 2018-03-25 ENCOUNTER — Telehealth: Payer: Self-pay

## 2018-03-25 ENCOUNTER — Telehealth: Payer: Self-pay | Admitting: Family Medicine

## 2018-03-25 MED ORDER — ATORVASTATIN CALCIUM 40 MG PO TABS: 40.0000 mg | ORAL_TABLET | Freq: Every day | ORAL | 0 refills | Status: DC

## 2018-03-25 NOTE — Telephone Encounter (Signed)
Someone from [employer] called requesting a refill on Atorvastatin. She stated that she had the patient on a backline and he had stated that we were refusing to refill that medication because he wouldn't come in but the doctor wasn't here anymore. I told the representative that I had told the patient that I could only send the doctor a message requesting a refill as was standard practice but he would have to come in and see her in order to get more refills. She stated he said he had an appointment at the end of August and asked if he had to wait until the end of August until he could get more pills. I said he does not have an appointment scheduled. I once again stated I had to send a message to the doctor requesting the refill because he has never seen her.   Dusty told me that the patient had cancelled his appt for August 30th through a mychart message stating the reason as being unhappy/changed provider.    Somehow the call was disconnected.

## 2018-03-25 NOTE — Telephone Encounter (Signed)
Patient left a voicemail requesting a call back to discuss his rx refill denial. [phone]

## 2018-03-25 NOTE — Telephone Encounter (Signed)
Refill sent for 90 day supply to his mail order. No other refills until seen. Please advise. Gwen Her. Mannie Stabile, MD

## 2018-03-25 NOTE — Telephone Encounter (Signed)
Spoke with patient regarding his refill request being denied. He stated that the first request had been denied because it was originally under Dr.Nelson. He stated that the pharmacy was stated they would resend it with Dr.Hagler on it, requesting the refill. I told him he had never seen Dr.Hagler and he said that's right. Your office is dropping [place]. I told him that I could send Dr.Hagler a message requesting a refill, however, he would have to schedule an appointment to establish care in order to get more refills. The patient said he wasn't going to pay Dr.Hagler to refill his medication. I told him that I understood he was upset, however, it is normal practice that he has to come in to see the doctor in order to have the medication refilled. He stated that if he couldn't get it done himself he would just forget about because he wasn't going to pay the doctor to refill his medication.

## 2018-03-26 NOTE — Telephone Encounter (Signed)
Called patient.  No answer.

## 2018-04-14 DIAGNOSIS — B353 Tinea pedis: Secondary | ICD-10-CM | POA: Diagnosis not present

## 2018-04-14 DIAGNOSIS — L03011 Cellulitis of right finger: Secondary | ICD-10-CM | POA: Diagnosis not present

## 2018-05-13 ENCOUNTER — Other Ambulatory Visit: Payer: Self-pay | Admitting: Family Medicine

## 2018-06-02 DIAGNOSIS — E559 Vitamin D deficiency, unspecified: Secondary | ICD-10-CM | POA: Diagnosis not present

## 2018-06-02 DIAGNOSIS — J449 Chronic obstructive pulmonary disease, unspecified: Secondary | ICD-10-CM | POA: Diagnosis not present

## 2018-06-02 DIAGNOSIS — E785 Hyperlipidemia, unspecified: Secondary | ICD-10-CM | POA: Diagnosis not present

## 2018-06-02 DIAGNOSIS — R5383 Other fatigue: Secondary | ICD-10-CM | POA: Diagnosis not present

## 2018-06-02 DIAGNOSIS — R0600 Dyspnea, unspecified: Secondary | ICD-10-CM | POA: Diagnosis not present

## 2018-06-02 DIAGNOSIS — Z125 Encounter for screening for malignant neoplasm of prostate: Secondary | ICD-10-CM | POA: Diagnosis not present

## 2018-06-13 ENCOUNTER — Encounter: Payer: Medicare Other | Admitting: Family Medicine

## 2018-07-14 DIAGNOSIS — E785 Hyperlipidemia, unspecified: Secondary | ICD-10-CM | POA: Diagnosis not present

## 2018-07-14 DIAGNOSIS — E559 Vitamin D deficiency, unspecified: Secondary | ICD-10-CM | POA: Diagnosis not present

## 2018-07-14 DIAGNOSIS — Z23 Encounter for immunization: Secondary | ICD-10-CM | POA: Diagnosis not present

## 2018-08-05 ENCOUNTER — Other Ambulatory Visit: Payer: Self-pay

## 2018-08-05 NOTE — Patient Outreach (Signed)
[facility] Care Management  08/05/2018  Lavelle Akel 1945/12/10 [id number]   Medication Adherence call to Mr. Hussain Maimone  spoke with  patient he received medication from [facility]  while he was switch doctor's and still has medication until he sees the new doctor patient is showing past due on Atorvastatin 40 mg. MrMuller is showing past due under [employer].   [facility] Management Direct Dial [phone]  Fax [phone] [email or web address]

## 2018-08-26 DIAGNOSIS — L570 Actinic keratosis: Secondary | ICD-10-CM | POA: Diagnosis not present

## 2018-08-26 DIAGNOSIS — L986 Other infiltrative disorders of the skin and subcutaneous tissue: Secondary | ICD-10-CM | POA: Diagnosis not present

## 2018-08-26 DIAGNOSIS — C44219 Basal cell carcinoma of skin of left ear and external auricular canal: Secondary | ICD-10-CM | POA: Diagnosis not present

## 2018-08-26 DIAGNOSIS — L821 Other seborrheic keratosis: Secondary | ICD-10-CM | POA: Diagnosis not present

## 2018-08-26 DIAGNOSIS — L819 Disorder of pigmentation, unspecified: Secondary | ICD-10-CM | POA: Diagnosis not present

## 2018-08-26 DIAGNOSIS — D485 Neoplasm of uncertain behavior of skin: Secondary | ICD-10-CM | POA: Diagnosis not present

## 2018-09-18 DIAGNOSIS — C44212 Basal cell carcinoma of skin of right ear and external auricular canal: Secondary | ICD-10-CM | POA: Diagnosis not present

## 2018-09-18 DIAGNOSIS — C44211 Basal cell carcinoma of skin of unspecified ear and external auricular canal: Secondary | ICD-10-CM | POA: Diagnosis not present

## 2018-10-02 DIAGNOSIS — D485 Neoplasm of uncertain behavior of skin: Secondary | ICD-10-CM | POA: Diagnosis not present

## 2018-10-02 DIAGNOSIS — L905 Scar conditions and fibrosis of skin: Secondary | ICD-10-CM | POA: Diagnosis not present

## 2018-10-13 DIAGNOSIS — I10 Essential (primary) hypertension: Secondary | ICD-10-CM | POA: Diagnosis not present

## 2018-10-13 DIAGNOSIS — E559 Vitamin D deficiency, unspecified: Secondary | ICD-10-CM | POA: Diagnosis not present

## 2018-10-13 DIAGNOSIS — E785 Hyperlipidemia, unspecified: Secondary | ICD-10-CM | POA: Diagnosis not present

## 2018-11-11 DIAGNOSIS — H2513 Age-related nuclear cataract, bilateral: Secondary | ICD-10-CM | POA: Diagnosis not present

## 2018-11-11 DIAGNOSIS — H52203 Unspecified astigmatism, bilateral: Secondary | ICD-10-CM | POA: Diagnosis not present

## 2018-11-11 DIAGNOSIS — H43813 Vitreous degeneration, bilateral: Secondary | ICD-10-CM | POA: Diagnosis not present

## 2018-11-11 DIAGNOSIS — H5203 Hypermetropia, bilateral: Secondary | ICD-10-CM | POA: Diagnosis not present

## 2018-11-17 DIAGNOSIS — I10 Essential (primary) hypertension: Secondary | ICD-10-CM | POA: Diagnosis not present

## 2018-11-17 DIAGNOSIS — E559 Vitamin D deficiency, unspecified: Secondary | ICD-10-CM | POA: Diagnosis not present

## 2019-02-19 DIAGNOSIS — I10 Essential (primary) hypertension: Secondary | ICD-10-CM | POA: Diagnosis not present

## 2019-02-19 DIAGNOSIS — E785 Hyperlipidemia, unspecified: Secondary | ICD-10-CM | POA: Diagnosis not present

## 2019-02-19 DIAGNOSIS — J449 Chronic obstructive pulmonary disease, unspecified: Secondary | ICD-10-CM | POA: Diagnosis not present

## 2019-02-19 DIAGNOSIS — E559 Vitamin D deficiency, unspecified: Secondary | ICD-10-CM | POA: Diagnosis not present

## 2019-05-27 DIAGNOSIS — J449 Chronic obstructive pulmonary disease, unspecified: Secondary | ICD-10-CM | POA: Diagnosis not present

## 2019-05-27 DIAGNOSIS — E785 Hyperlipidemia, unspecified: Secondary | ICD-10-CM | POA: Diagnosis not present

## 2019-05-27 DIAGNOSIS — I10 Essential (primary) hypertension: Secondary | ICD-10-CM | POA: Diagnosis not present

## 2019-05-27 DIAGNOSIS — E559 Vitamin D deficiency, unspecified: Secondary | ICD-10-CM | POA: Diagnosis not present

## 2019-07-29 ENCOUNTER — Other Ambulatory Visit: Payer: Self-pay

## 2019-07-29 ENCOUNTER — Ambulatory Visit (INDEPENDENT_AMBULATORY_CARE_PROVIDER_SITE_OTHER): Payer: Medicare Other

## 2019-08-21 ENCOUNTER — Other Ambulatory Visit: Payer: Self-pay | Admitting: Internal Medicine

## 2019-08-26 DIAGNOSIS — Z85828 Personal history of other malignant neoplasm of skin: Secondary | ICD-10-CM | POA: Diagnosis not present

## 2019-08-26 DIAGNOSIS — L821 Other seborrheic keratosis: Secondary | ICD-10-CM | POA: Diagnosis not present

## 2019-08-26 DIAGNOSIS — L720 Epidermal cyst: Secondary | ICD-10-CM | POA: Diagnosis not present

## 2019-08-26 DIAGNOSIS — L570 Actinic keratosis: Secondary | ICD-10-CM | POA: Diagnosis not present

## 2019-09-07 ENCOUNTER — Other Ambulatory Visit: Payer: Self-pay | Admitting: Internal Medicine

## 2019-09-08 ENCOUNTER — Other Ambulatory Visit (INDEPENDENT_AMBULATORY_CARE_PROVIDER_SITE_OTHER): Payer: Self-pay | Admitting: Internal Medicine

## 2019-09-08 MED ORDER — ANORO ELLIPTA 62.5-25 MCG/INH IN AEPB: 1.0000 | INHALATION_SPRAY | Freq: Every day | RESPIRATORY_TRACT | 0 refills | Status: DC

## 2019-10-09 ENCOUNTER — Other Ambulatory Visit (INDEPENDENT_AMBULATORY_CARE_PROVIDER_SITE_OTHER): Payer: Self-pay | Admitting: Internal Medicine

## 2019-10-15 ENCOUNTER — Telehealth (INDEPENDENT_AMBULATORY_CARE_PROVIDER_SITE_OTHER): Payer: Self-pay | Admitting: Internal Medicine

## 2019-10-15 NOTE — Telephone Encounter (Signed)
Please let the patient know that we do not have this information yet.  I think you will be coordinated with the [place] Department of Health and social services.  We will know some information I hope in the next week or 2.

## 2019-11-10 ENCOUNTER — Ambulatory Visit: Payer: Medicare Other

## 2019-11-10 ENCOUNTER — Other Ambulatory Visit: Payer: Self-pay | Admitting: Internal Medicine

## 2019-11-16 DIAGNOSIS — H5203 Hypermetropia, bilateral: Secondary | ICD-10-CM | POA: Diagnosis not present

## 2019-11-16 DIAGNOSIS — H2513 Age-related nuclear cataract, bilateral: Secondary | ICD-10-CM | POA: Diagnosis not present

## 2019-11-16 DIAGNOSIS — H52203 Unspecified astigmatism, bilateral: Secondary | ICD-10-CM | POA: Diagnosis not present

## 2019-11-19 ENCOUNTER — Ambulatory Visit: Payer: Medicare Other | Attending: Internal Medicine

## 2019-11-19 DIAGNOSIS — Z23 Encounter for immunization: Secondary | ICD-10-CM | POA: Insufficient documentation

## 2019-11-19 NOTE — Progress Notes (Signed)
   Covid-19 Vaccination Clinic  Name:  David Rodriguez    MRN: [id number] DOB: [birth year]  11/19/2019  [name] was observed post Covid-19 immunization for 15 minutes without incidence. He was provided with Vaccine Information Sheet and instruction to access the V-Safe system.   [name] was instructed to call 911 with any severe reactions post vaccine: Marland Kitchen Difficulty breathing  . Swelling of your face and throat  . A fast heartbeat  . A bad rash all over your body  . Dizziness and weakness    Immunizations Administered    Name Date Dose VIS Date Route   Pfizer COVID-19 Vaccine 11/19/2019  5:40 PM 0.3 mL 09/25/2019 Intramuscular   Manufacturer: [place]   Lot: [id number]   [place]: [id number]

## 2019-11-22 IMAGING — DX DG CHEST 2V
2 series · 2 of 2 positions shown · non-contrast
Comparison: Radiographs 12/13/2017.

CLINICAL DATA: Abnormal chest x-ray. Ex-smoker with history of
COPD. No current complaints.

EXAM:
CHEST - 2 VIEW

[chest pa]
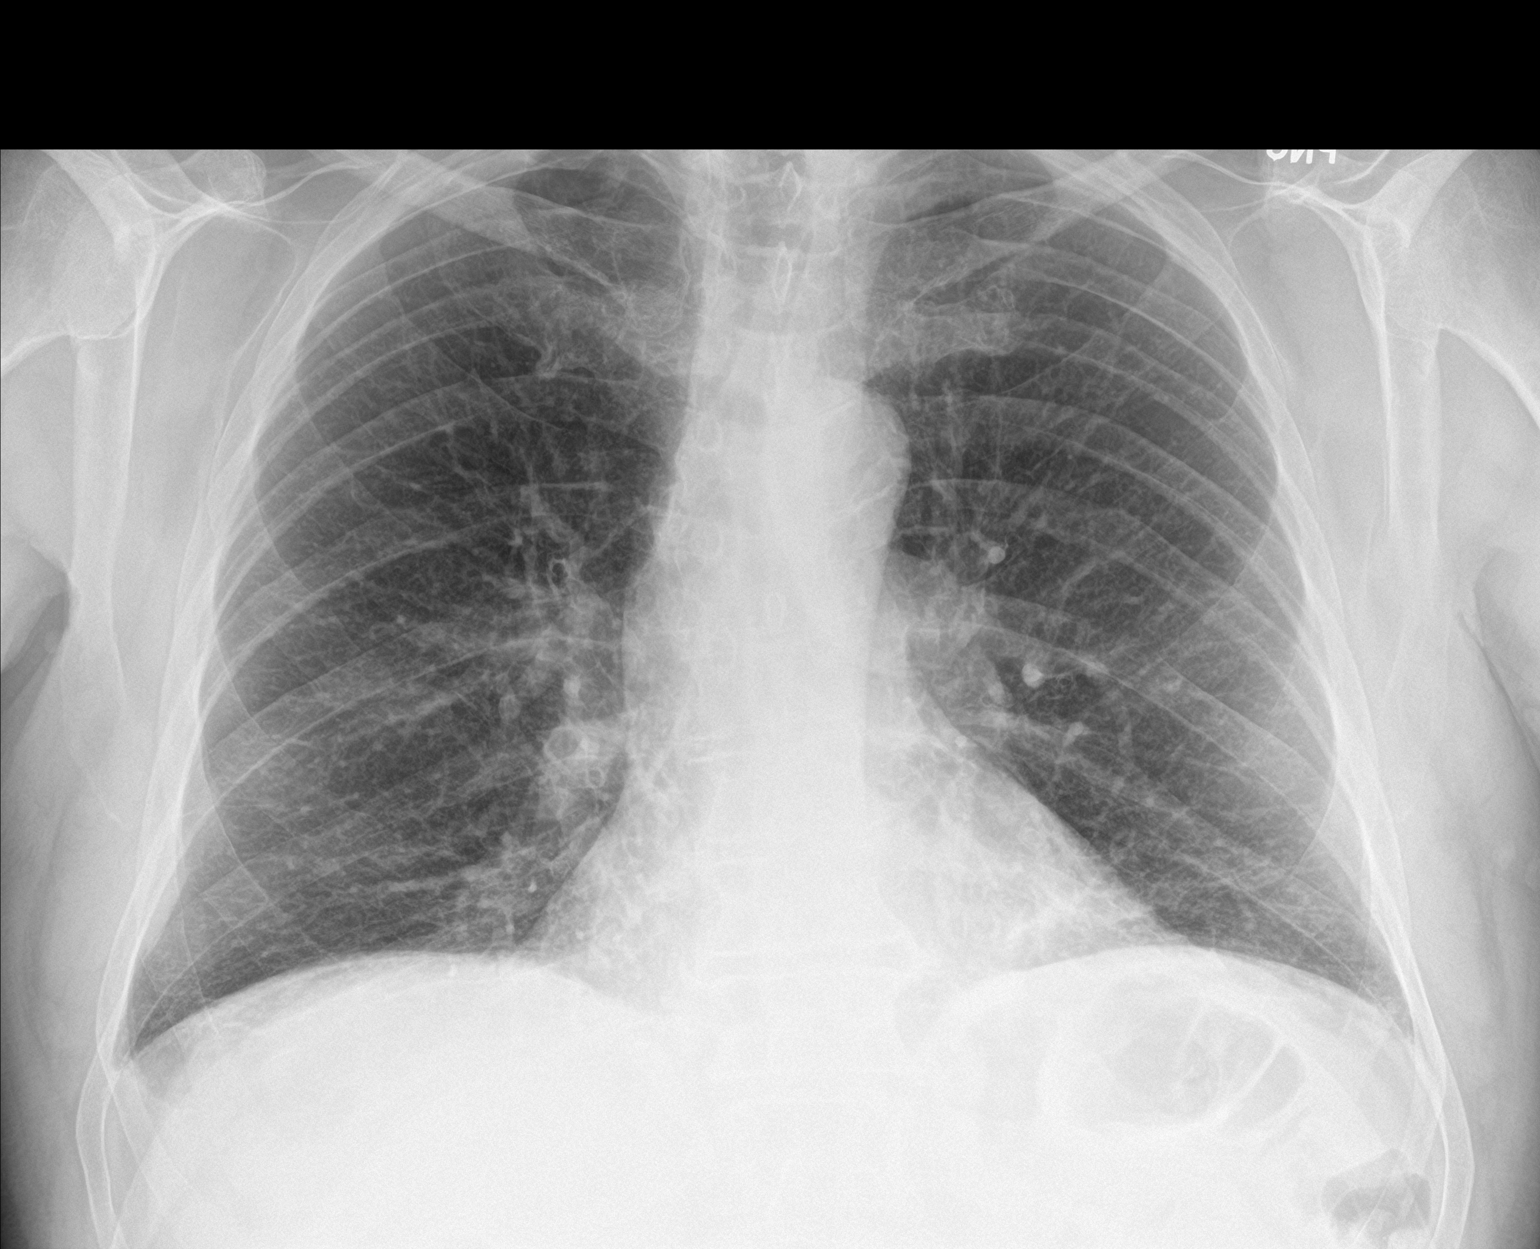

[chest lat]
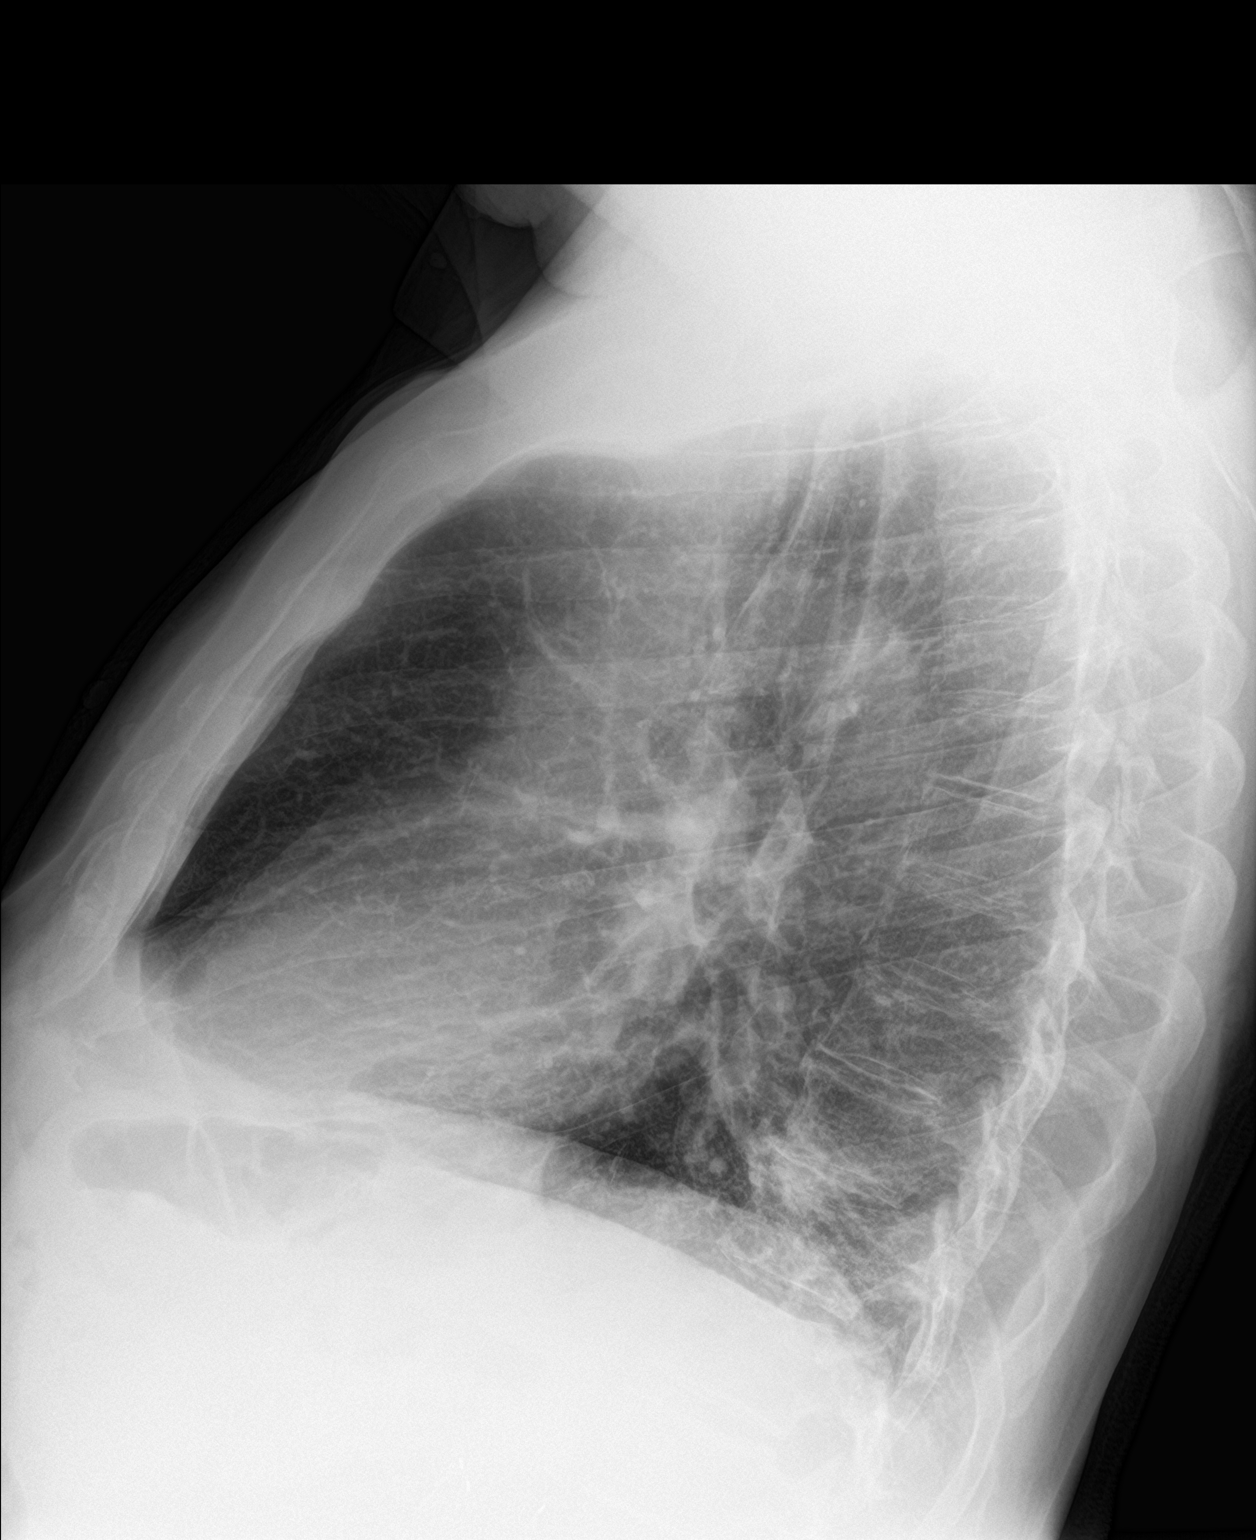

[2 of 2 positions shown; findings below may reference images not displayed]

FINDINGS: The heart size and mediastinal contours are stable with aortic
atherosclerosis. There is a lesser degree of inspiration with mild
resulting bibasilar atelectasis. There is probable underlying
chronic lung disease with interstitial prominence. Density on the
lateral view projecting over the lower thoracic spine appears to
reflect an osteophyte on the current study, seen on both views. No
focal airspace disease identified. There are no acute osseous
findings.
IMPRESSION: 1. Persistent density projecting over the lower thoracic spine on
the lateral view is attributed to a paraspinal osteophyte.
2. No focal airspace disease or suspected acute findings.

## 2019-12-14 ENCOUNTER — Ambulatory Visit: Payer: Self-pay

## 2019-12-15 ENCOUNTER — Ambulatory Visit: Payer: Medicare Other | Attending: Internal Medicine

## 2019-12-15 DIAGNOSIS — Z23 Encounter for immunization: Secondary | ICD-10-CM | POA: Insufficient documentation

## 2019-12-15 NOTE — Progress Notes (Signed)
   Covid-19 Vaccination Clinic  Name:  David Rodriguez    MRN: [id number] DOB: [birth year]  12/15/2019  [name] was observed post Covid-19 immunization for 15 minutes without incident. He was provided with Vaccine Information Sheet and instruction to access the V-Safe system.   [name] was instructed to call 911 with any severe reactions post vaccine: Marland Kitchen Difficulty breathing  . Swelling of face and throat  . A fast heartbeat  . A bad rash all over body  . Dizziness and weakness   Immunizations Administered    Name Date Dose VIS Date Route   Pfizer COVID-19 Vaccine 12/15/2019  8:23 AM 0.3 mL 09/25/2019 Intramuscular   Manufacturer: [place]   Lot: [id number]   [place]: [id number]

## 2020-02-10 ENCOUNTER — Other Ambulatory Visit: Payer: Self-pay | Admitting: *Deleted

## 2020-02-10 DIAGNOSIS — I714 Abdominal aortic aneurysm, without rupture, unspecified: Secondary | ICD-10-CM

## 2020-02-10 DIAGNOSIS — M25562 Pain in left knee: Secondary | ICD-10-CM

## 2020-02-10 DIAGNOSIS — M25561 Pain in right knee: Secondary | ICD-10-CM

## 2020-02-11 ENCOUNTER — Telehealth (HOSPITAL_COMMUNITY): Payer: Self-pay

## 2020-02-11 NOTE — Telephone Encounter (Signed)

## 2020-02-12 ENCOUNTER — Ambulatory Visit (HOSPITAL_COMMUNITY)
Admission: RE | Admit: 2020-02-12 | Discharge: 2020-02-12 | Disposition: A | Discharge status: Home / Self Care | Payer: Medicare Other | Source: Ambulatory Visit | Attending: Vascular Surgery | Admitting: Vascular Surgery

## 2020-02-12 ENCOUNTER — Encounter: Payer: Self-pay | Admitting: Vascular Surgery

## 2020-02-12 ENCOUNTER — Ambulatory Visit (INDEPENDENT_AMBULATORY_CARE_PROVIDER_SITE_OTHER): Payer: Medicare Other | Admitting: Vascular Surgery

## 2020-02-12 ENCOUNTER — Other Ambulatory Visit: Payer: Self-pay

## 2020-02-12 ENCOUNTER — Ambulatory Visit (INDEPENDENT_AMBULATORY_CARE_PROVIDER_SITE_OTHER)
Admission: RE | Admit: 2020-02-12 | Discharge: 2020-02-12 | Disposition: A | Discharge status: Home / Self Care | Payer: Medicare Other | Source: Ambulatory Visit | Attending: Vascular Surgery | Admitting: Vascular Surgery

## 2020-02-12 VITALS — BP 128/75 | HR 64 | Temp 97.8°F | Resp 16 | Ht 69.0 in | Wt 175.0 lb

## 2020-02-12 DIAGNOSIS — I714 Abdominal aortic aneurysm, without rupture, unspecified: Secondary | ICD-10-CM

## 2020-02-12 DIAGNOSIS — M25562 Pain in left knee: Secondary | ICD-10-CM

## 2020-02-12 DIAGNOSIS — M25561 Pain in right knee: Secondary | ICD-10-CM | POA: Diagnosis not present

## 2020-02-12 DIAGNOSIS — I724 Aneurysm of artery of lower extremity: Secondary | ICD-10-CM | POA: Diagnosis not present

## 2020-02-12 NOTE — Progress Notes (Signed)
Patient ID: David Rodriguez, male   DOB: 20-Sep-1946, 74 y.o.   MRN: [id number]  Reason for Consult: AAA (f/u)   Referred by Doree Albee, MD  Subjective:     HPI:  David Rodriguez is a 74 y.o. male with history of endovascular aneurysm repair performed in 2011 in [place] near [place].  He has been followed closely there I saw him about 2 years ago after he moved to [place] to be closer to family.  Patient remains very active.  He is actually intentionally lost 30 pounds in the past year.  He is now a non-smoker remains very active has no complaints related to today's visit.  Past Medical History:  Diagnosis Date  . Allergy   . 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 Hyperlipidemia   . Tobacco abuse 10/30/2016   Family History  Problem Relation Age of Onset  . Hypertension Mother   . Cancer Father        MESOTHELIOMA  . Cancer Sister        LUNG  . Cancer Brother        LUNG   Past Surgical History:  Procedure Laterality Date  . ABDOMINAL AORTIC ANEURYSM REPAIR  2013  . CARDIAC CATHETERIZATION  2017  . TONSILLECTOMY AND ADENOIDECTOMY  1953    Short Social History:  Social History   Tobacco Use  . Smoking status: Former Smoker    Packs/day: 1.00    Years: 50.00    Pack years: 50.00    Types: Cigarettes    Quit date: 08/26/2017    Years since quitting: 2.4  . Smokeless tobacco: Never Used  Substance Use Topics  . Alcohol use: No    Allergies  Allergen Reactions  . Iodine Hives    Current Outpatient Medications  Medication Sig Dispense Refill  . atorvastatin (LIPITOR) 40 MG tablet TAKE 1 TABLET BY MOUTH  DAILY 90 tablet 0  . cholecalciferol (VITAMIN D) 1000 units tablet Take 5,000 Units by mouth daily.    Marland Kitchen umeclidinium-vilanterol (ANORO ELLIPTA) 62.5-25 MCG/INH AEPB Inhale 1 puff into the lungs daily. 180 each 0  . aspirin EC 81 MG tablet Take 81 mg by mouth daily.     No current facility-administered medications for this visit.    Review of Systems    Constitutional:  Constitutional negative. HENT: HENT negative.  Eyes: Eyes negative.  Respiratory: Respiratory negative.  Cardiovascular: Cardiovascular negative.  GI: Gastrointestinal negative.  Musculoskeletal: Musculoskeletal negative.  Skin: Skin negative.  Neurological: Neurological negative. Hematologic: Hematologic/lymphatic negative.  Psychiatric: Psychiatric negative.        Objective:  Objective   Vitals:   02/12/20 0839  BP: 128/75  Pulse: 64  Resp: 16  Temp: 97.8 F (36.6 C)  TempSrc: Temporal  SpO2: 93%  Weight: 175 lb (79.4 kg)  Height: 5\' 9"  (1.753 m)   Body mass index is 25.84 kg/m.  Physical Exam HENT:     Head: Normocephalic.     Nose:     Comments: Mask in place Eyes:     Pupils: Pupils are equal, round, and reactive to light.  Cardiovascular:     Rate and Rhythm: Normal rate.     Pulses:          Radial pulses are 2+ on the right side and 2+ on the left side.       Popliteal pulses are 3+ on the right side and 3+ on the left side.  Dorsalis pedis pulses are 2+ on the right side and 2+ on the left side.  Pulmonary:     Effort: Pulmonary effort is normal.     Breath sounds: Normal breath sounds.  Abdominal:     General: Abdomen is flat.     Palpations: Abdomen is soft. There is no mass.  Musculoskeletal:        General: No swelling. Normal range of motion.  Skin:    General: Skin is warm and dry.     Capillary Refill: Capillary refill takes less than 2 seconds.  Neurological:     General: No focal deficit present.     Mental Status: He is alert.  Psychiatric:        Mood and Affect: Mood normal.        Behavior: Behavior normal.        Thought Content: Thought content normal.        Judgment: Judgment normal.     Data I have independently interpreted his bilateral lower extremity arterial duplex which demonstrates popliteal artery on the right 1.5 cm on the left 1.3 cm.  I have independently interpreted his abdominal  aortic aneurysm duplex which demonstrates largest measurement 4.4 cm.  There is a patent endovascular aneurysm repair with no evidence of endoleak essentially unchanged from previous study.     Assessment/Plan:     74 year old male now 10 years out from endovascular aneurysm repair performed in [place].  He is doing very well from this.  He has very prominent popliteal pulses and on the right side is found to be 1.5 cm today.  From the standpoint he will follow-up in 1 year with bilateral lower extremity aneurysm duplexes as well as previous EVAR duplex.  If he has issues before this we will be happy to see him sooner.      Waynetta Sandy MD [facility]

## 2020-02-15 ENCOUNTER — Other Ambulatory Visit: Payer: Self-pay | Admitting: *Deleted

## 2020-02-15 DIAGNOSIS — I714 Abdominal aortic aneurysm, without rupture, unspecified: Secondary | ICD-10-CM

## 2020-02-15 DIAGNOSIS — I724 Aneurysm of artery of lower extremity: Secondary | ICD-10-CM

## 2020-02-22 ENCOUNTER — Ambulatory Visit (INDEPENDENT_AMBULATORY_CARE_PROVIDER_SITE_OTHER): Payer: Medicare Other | Admitting: Nurse Practitioner

## 2020-02-22 ENCOUNTER — Encounter (INDEPENDENT_AMBULATORY_CARE_PROVIDER_SITE_OTHER): Payer: Self-pay | Admitting: Nurse Practitioner

## 2020-02-22 ENCOUNTER — Other Ambulatory Visit: Payer: Self-pay

## 2020-02-22 VITALS — BP 160/80 | HR 73 | Temp 98.1°F | Ht 69.0 in | Wt 178.6 lb

## 2020-02-22 DIAGNOSIS — Z1329 Encounter for screening for other suspected endocrine disorder: Secondary | ICD-10-CM

## 2020-02-22 DIAGNOSIS — E785 Hyperlipidemia, unspecified: Secondary | ICD-10-CM | POA: Diagnosis not present

## 2020-02-22 DIAGNOSIS — Z23 Encounter for immunization: Secondary | ICD-10-CM

## 2020-02-22 DIAGNOSIS — R030 Elevated blood-pressure reading, without diagnosis of hypertension: Secondary | ICD-10-CM

## 2020-02-22 DIAGNOSIS — Z0001 Encounter for general adult medical examination with abnormal findings: Secondary | ICD-10-CM

## 2020-02-22 DIAGNOSIS — K469 Unspecified abdominal hernia without obstruction or gangrene: Secondary | ICD-10-CM

## 2020-02-22 DIAGNOSIS — Z131 Encounter for screening for diabetes mellitus: Secondary | ICD-10-CM

## 2020-02-22 DIAGNOSIS — E559 Vitamin D deficiency, unspecified: Secondary | ICD-10-CM | POA: Diagnosis not present

## 2020-02-22 NOTE — Progress Notes (Signed)
Subjective:  Patient ID: David Rodriguez, male    DOB: 1946-01-29  Age: 74 y.o. MRN: [id number]  CC:  Chief Complaint  Patient presents with  . Annual Exam      HPI  This patient arrives today for his annual physical exam.  Immunizations: Per chart review it looks like he is due for the Pneumovax 23 and shingles vaccine.  Health care screenings: Colon cancer screening was completed last in 2015 he tells me he was told to repeat this in 10 years.  He is a current tobacco smoker and reports that he has smoked on average about 1 pack/day for the last 50 years.  Thus his pack year is 50.  We did talk about lung cancer screening today and he is not wanting to do this at this time.  He would be due for sexual transmitted infection screening, however he would like to hold off on this.  He already has been treated for abdominal aortic aneurysm and continues to follow-up with his vascular surgeon for this on a regular basis.  He is due for depression screen today.  Hepatitis C screening has been completed and was negative back in 2018.  Bilateral hernias: He tells me he thinks he has bilateral lower abdominal hernias.  He tells me that the spots are not painful, they are soft to touch, he does normally have a regular bowel movement.  He is mainly concerned because they seem to be getting bigger with time.   Past Medical History:  Diagnosis Date  . Allergy   . 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 Hyperlipidemia   . Tobacco abuse 10/30/2016      Family History  Problem Relation Age of Onset  . Hypertension Mother   . Cancer Father        MESOTHELIOMA  . Cancer Sister        LUNG  . Cancer Brother        LUNG    Social History   Social History Narrative   Moved from [place]   Retired Games developer   Lives with [place] History   Tobacco Use  . Smoking status: Former Smoker    Packs/day: 1.00    Years: 50.00    Pack years: 50.00    Types: Cigarettes    Quit date:  08/26/2017    Years since quitting: 2.4  . Smokeless tobacco: Never Used  Substance Use Topics  . Alcohol use: No     Current Meds  Medication Sig  . atorvastatin (LIPITOR) 40 MG tablet TAKE 1 TABLET BY MOUTH  DAILY  . cholecalciferol (VITAMIN D) 1000 units tablet Take 5,000 Units by mouth daily.  Marland Kitchen umeclidinium-vilanterol (ANORO ELLIPTA) 62.5-25 MCG/INH AEPB Inhale 1 puff into the lungs daily.    ROS:  Reviewed and negative unless otherwise stated in HPI   Objective:   Today's Vitals: BP (!) 160/80 (BP Location: Left Arm, Patient Position: Sitting, Cuff Size: Normal)   Pulse 73   Temp 98.1 F (36.7 C) (Temporal)   Ht 5' 9" (1.753 m)   Wt 178 lb 9.6 oz (81 kg)   SpO2 95%   BMI 26.37 kg/m  Vitals with BMI 02/22/2020 02/12/2020 03/14/2018  Height 5' 9" 5' 9" 5' 9"  Weight 178 lbs 10 oz 175 lbs 199 lbs  BMI 26.36 93.23 55.73  Systolic 220 254 270  Diastolic 80 75 86  Pulse 73 64 73     Physical Exam  Vitals reviewed.  Constitutional:      General: He is not in acute distress.    Appearance: Normal appearance. He is not ill-appearing.  HENT:     Head: Normocephalic and atraumatic.     Right Ear: Tympanic membrane, ear canal and external ear normal.     Left Ear: Tympanic membrane, ear canal and external ear normal.  Eyes:     General: No scleral icterus.    Extraocular Movements: Extraocular movements intact.     Conjunctiva/sclera: Conjunctivae normal.     Pupils: Pupils are equal, round, and reactive to light.  Neck:     Vascular: No carotid bruit.  Cardiovascular:     Rate and Rhythm: Normal rate and regular rhythm.     Pulses: Normal pulses.     Heart sounds: Normal heart sounds.  Pulmonary:     Effort: Pulmonary effort is normal.     Breath sounds: Normal breath sounds.  Abdominal:     General: Bowel sounds are normal. There is no distension.     Palpations: There is no mass.     Tenderness: There is no abdominal tenderness.     Hernia: A hernia is  present. Hernia is present in the right femoral area and left femoral area.  Musculoskeletal:        General: No swelling or tenderness.     Cervical back: Normal range of motion and neck supple. No rigidity.  Lymphadenopathy:     Cervical: No cervical adenopathy.  Skin:    General: Skin is warm and dry.  Neurological:     General: No focal deficit present.     Mental Status: He is alert and oriented to person, place, and time.     Cranial Nerves: No cranial nerve deficit.     Sensory: No sensory deficit.     Motor: No weakness.     Gait: Gait normal.  Psychiatric:        Mood and Affect: Mood normal.        Behavior: Behavior normal.        Judgment: Judgment normal.          Assessment and Plan   1. Encounter for general adult medical examination with abnormal findings   2. Need for vaccination   3. Hyperlipidemia, unspecified hyperlipidemia type   4. Screening for diabetes mellitus   5. Screening for thyroid disorder   6. Hernia of abdominal cavity   7. Elevated blood pressure reading      Plan: 1., 2.  I will administer the Pneumovax 23 vaccine today.  We did discuss the shingles vaccine and I encouraged him to discuss this with his pharmacy in approximately 2 weeks if he is interested in have these administered.  He tells me he will consider this.  Will be due for colon cancer screening again in 2025, will not do sexual transmitted infection screening per his request, he is not due for AAA screening, depression screen was negative, hepatitis screen has been completed and was negative, he has elected to not undergo lung cancer screening at this time after he had a risk versus benefits discussion.  I encouraged him to let me know if he changes his mind.  3.-5.  We will collect blood work for further evaluation today.  6.  It does appear that he has bilateral femoral hernias, they do not appear to be currently strangulated or gangrenous. Thus I will refer him to general  surgery for further evaluation and  management.  7.  Blood pressure is a bit above goal today, however he did express to me that he is undergoing a stressful situation with his daughter.  Apparently she seems to be a hoarder and he is trying to help her get her home cleaned out and also try to find her help with psychiatry.  Per chart review I see that in the past his blood pressure has been better, thus we will hold off on starting him on blood pressure medicine today.  Tests ordered Orders Placed This Encounter  Procedures  . Pneumococcal polysaccharide vaccine 23-valent greater than or equal to 2yo subcutaneous/IM  . CBC  . CMP with eGFR([facility])  . Lipid Panel  . Hemoglobin A1c  . TSH  . Vitamin D, 25-hydroxy  . Ambulatory referral to General Surgery      No orders of the defined types were placed in this encounter.  In addition to performing his annual exam, I also performed an office visit to address his concerns related to the hernias.  Patient to follow-up in 3 months or sooner as needed.  Ailene Ards, NP

## 2020-02-23 LAB — COMPLETE METABOLIC PANEL WITH GFR
AG Ratio: 1.8 (calc) (ref 1.0–2.5)
ALT: 11 U/L (ref 9–46)
AST: 12 U/L (ref 10–35)
Albumin: 4.5 g/dL (ref 3.6–5.1)
Alkaline phosphatase (APISO): 70 U/L (ref 35–144)
BUN: 12 mg/dL (ref 7–25)
CO2: 28 mmol/L (ref 20–32)
Calcium: 9.6 mg/dL (ref 8.6–10.3)
Chloride: 104 mmol/L (ref 98–110)
Creat: 0.97 mg/dL (ref 0.70–1.18)
GFR, Est African American: 88 mL/min/{1.73_m2} (ref >=60)
GFR, Est Non African American: 76 mL/min/{1.73_m2} (ref >=60)
Globulin: 2.5 g/dL (calc) (ref 1.9–3.7)
Glucose, Bld: 80 mg/dL (ref 65–99)
Potassium: 4.6 mmol/L (ref 3.5–5.3)
Sodium: 140 mmol/L (ref 135–146)
Total Bilirubin: 1 mg/dL (ref 0.2–1.2)
Total Protein: 7 g/dL (ref 6.1–8.1)

## 2020-02-23 LAB — LIPID PANEL
Cholesterol: 155 mg/dL (ref <200)
HDL: 51 mg/dL (ref >=40)
LDL Cholesterol (Calc): 87 mg/dL (calc)
Non-HDL Cholesterol (Calc): 104 mg/dL (calc) (ref <130)
Total CHOL/HDL Ratio: 3 (calc) (ref <5.0)
Triglycerides: 77 mg/dL (ref <150)

## 2020-02-23 LAB — HEMOGLOBIN A1C
Hgb A1c MFr Bld: 5.3 % of total Hgb (ref <5.7)
Mean Plasma Glucose: 105 (calc)
eAG (mmol/L): 5.8 (calc)

## 2020-02-23 LAB — CBC
HCT: 52 % — ABNORMAL HIGH (ref 38.5–50.0)
Hemoglobin: 17.5 g/dL — ABNORMAL HIGH (ref 13.2–17.1)
MCH: 30.7 pg (ref 27.0–33.0)
MCHC: 33.7 g/dL (ref 32.0–36.0)
MCV: 91.2 fL (ref 80.0–100.0)
MPV: 9.2 fL (ref 7.5–12.5)
Platelets: 272 10*3/uL (ref 140–400)
RBC: 5.7 10*6/uL (ref 4.20–5.80)
RDW: 13.4 % (ref 11.0–15.0)
WBC: 6.4 10*3/uL (ref 3.8–10.8)

## 2020-02-23 LAB — TSH: TSH: 0.87 mIU/L (ref 0.40–4.50)

## 2020-02-23 LAB — VITAMIN D 25 HYDROXY (VIT D DEFICIENCY, FRACTURES): Vit D, 25-Hydroxy: 66 ng/mL (ref 30–100)

## 2020-02-23 NOTE — Addendum Note (Signed)
Addended by: Jeralyn Ruths E on: 02/23/2020 02:57 PM   Modules accepted: Level of Service

## 2020-03-08 ENCOUNTER — Other Ambulatory Visit: Payer: Self-pay

## 2020-03-08 ENCOUNTER — Encounter: Payer: Self-pay | Admitting: General Surgery

## 2020-03-08 ENCOUNTER — Ambulatory Visit: Payer: Medicare Other | Admitting: General Surgery

## 2020-03-08 VITALS — BP 148/79 | HR 73 | Temp 96.6°F | Resp 14 | Ht 69.0 in | Wt 178.0 lb

## 2020-03-08 DIAGNOSIS — K4021 Bilateral inguinal hernia, without obstruction or gangrene, recurrent: Secondary | ICD-10-CM

## 2020-03-08 DIAGNOSIS — K4020 Bilateral inguinal hernia, without obstruction or gangrene, not specified as recurrent: Secondary | ICD-10-CM | POA: Insufficient documentation

## 2020-03-08 NOTE — Patient Instructions (Signed)

## 2020-03-08 NOTE — Progress Notes (Signed)
[facility] History and Physical  Reason for Referral: Bilateral Inguinal hernias  Referring Physician: Doree Albee, MD   Chief Complaint    Hernia      David Rodriguez is a 74 y.o. male.  HPI: David Rodriguez is a very nice 74 yo with a history of COPD and HLD who had his well check a few weeks ago. He had noticed a bulge in his left groin and known about a hernia on that side for 30 + years, and started to notice a bulge in the right groin recently. The bulges have been getting somewhat larger but he denies any pain or discomfort.  He has regular Bms except for some occasional constipation, and denies any symptoms of incarceration or obstruction.  He was referred for further discussion. 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   Past Medical History:  Diagnosis Date  . Allergy   . 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 Hyperlipidemia   . Tobacco abuse 10/30/2016    Past Surgical History:  Procedure Laterality Date  . ABDOMINAL AORTIC ANEURYSM REPAIR  2013  . CARDIAC CATHETERIZATION  2017  . TONSILLECTOMY AND ADENOIDECTOMY  1953    Family History  Problem Relation Age of Onset  . Hypertension Mother   . Cancer Father        MESOTHELIOMA  . Cancer Sister        LUNG  . Cancer Brother        LUNG    Social History   Tobacco Use  . Smoking status: Former Smoker    Packs/day: 1.00    Years: 50.00    Pack years: 50.00    Types: Cigarettes    Quit date: 08/26/2017    Years since quitting: 2.5  . Smokeless tobacco: Never Used  Substance Use Topics  . Alcohol use: No  . Drug use: No    Medications: I have reviewed the patient's current medications. Allergies as of 03/08/2020      Reactions   Iodine Hives      Medication List       Accurate as of Mar 08, 2020 10:10 AM. If you have any questions, ask your nurse or doctor.        Anoro Ellipta 62.5-25 MCG/INH Aepb Generic drug: umeclidinium-vilanterol Inhale 1 puff  into the lungs daily.   atorvastatin 40 MG tablet Commonly known as: LIPITOR TAKE 1 TABLET BY MOUTH  DAILY   cholecalciferol 1000 units tablet Commonly known as: VITAMIN D Take 5,000 Units by mouth daily.        ROS:  A comprehensive review of systems was negative except for: Respiratory: positive for COPD Musculoskeletal: positive for back pain  Blood pressure (!) 148/79, pulse 73, temperature (!) 96.6 F (35.9 C), temperature source Other (Comment), resp. rate 14, height 5\' 9"  (1.753 m), weight 178 lb (80.7 kg), SpO2 93 %. Physical Exam Vitals reviewed.  Constitutional:      Appearance: He is normal weight.  HENT:     Head: Normocephalic and atraumatic.     Nose: Nose normal.     Mouth/Throat:     Mouth: Mucous membranes are moist.  Eyes:     Extraocular Movements: Extraocular movements intact.     Pupils: Pupils are equal, round, and reactive to light.  Cardiovascular:     Rate and Rhythm: Normal rate and regular rhythm.  Pulmonary:     Effort: Pulmonary effort is normal.  Breath sounds: Normal breath sounds.  Abdominal:     Palpations: Abdomen is soft.     Tenderness: There is no abdominal tenderness.     Hernia: There is no hernia in the left inguinal area or right inguinal area.     Comments: Inguinal hernias noted that are easily reducible and nontender. No femoral hernias noted, and no bulge inferior to the inguinal crease.   Musculoskeletal:        General: No swelling. Normal range of motion.     Cervical back: Normal range of motion.  Skin:    General: Skin is warm and dry.  Neurological:     General: No focal deficit present.     Mental Status: He is alert and oriented to person, place, and time.  Psychiatric:        Mood and Affect: Mood normal.        Behavior: Behavior normal.        Thought Content: Thought content normal.        Judgment: Judgment normal.     Results: None   Assessment & Plan:  David Rodriguez is a 74 y.o. 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 He is aware of them and has had one for over 30 years.\  We discussed inguinal hernias and reasons for repair. We discussed that he does not have to get it repaired and can monitor the hernia and warned of signs of incarceration and strangulation.    He has NO evidence of a femoral hernia which we would be more likely to fix even if asymptomatic given the risk of incarceration. Ms. David Rodriguez note mentioned femoral hernias but these are inguinal hernias and NOT femoral.    Discussed the risk and benefits including, bleeding, infection, use of mesh, risk of recurrence, risk of nerve damage causing numbness or changes in sensation, risk of damage to the cord structures. The patient understands the risk and benefits of repair with mesh, and has decided to proceed.  We also discussed open versus laparoscopic surgery and the use of mesh. We discussed that I do open repairs with mesh, and that this is considered equivalent to laparoscopic surgery. We discussed reasons for opting for laparoscopic surgery including if a bilateral repair is needed or if a patient has a recurrence after an open repair.  At this time he is going to hold on surgery and is aware of reasons to return or to go to the ED.   All questions were answered to the satisfaction of the patient and family.    David Rodriguez 03/08/2020, 10:10 AM

## 2020-04-05 ENCOUNTER — Other Ambulatory Visit (INDEPENDENT_AMBULATORY_CARE_PROVIDER_SITE_OTHER): Payer: Self-pay

## 2020-04-05 MED ORDER — ANORO ELLIPTA 62.5-25 MCG/INH IN AEPB: 1.0000 | INHALATION_SPRAY | Freq: Every day | RESPIRATORY_TRACT | 0 refills | Status: DC

## 2020-05-31 ENCOUNTER — Ambulatory Visit (INDEPENDENT_AMBULATORY_CARE_PROVIDER_SITE_OTHER): Payer: Medicare Other | Admitting: Internal Medicine

## 2020-05-31 ENCOUNTER — Encounter (INDEPENDENT_AMBULATORY_CARE_PROVIDER_SITE_OTHER): Payer: Self-pay | Admitting: Internal Medicine

## 2020-05-31 ENCOUNTER — Other Ambulatory Visit: Payer: Self-pay

## 2020-05-31 VITALS — BP 136/70 | HR 65 | Temp 97.5°F | Ht 69.0 in | Wt 174.4 lb

## 2020-05-31 DIAGNOSIS — E559 Vitamin D deficiency, unspecified: Secondary | ICD-10-CM | POA: Diagnosis not present

## 2020-05-31 DIAGNOSIS — E785 Hyperlipidemia, unspecified: Secondary | ICD-10-CM | POA: Diagnosis not present

## 2020-05-31 NOTE — Progress Notes (Signed)
Metrics: Intervention Frequency ACO  Documented Smoking Status Yearly  Screened one or more times in 24 months  Cessation Counseling or  Active cessation medication Past 24 months  Past 24 months   Guideline developer: UpToDate (See UpToDate for funding source) Date Released: 2014       Wellness Office Visit  Subjective:  Patient ID: David Rodriguez, male    DOB: 1946-10-10  Age: 74 y.o. MRN: [id number]  CC: This man comes in for follow-up of hyperlipidemia and vitamin D deficiency. HPI  He is doing reasonably well and he continues to do intermittent fasting and tells me that he is only eating 1 meal a day at around 3-4 PM.  He has lost about 40 pounds since he started doing intermittent fasting.  He feels much improved. He continues to take statin therapy.  He has no history of coronary artery disease, cerebrovascular disease or diabetes. Past Medical History:  Diagnosis Date  . Allergy   . 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 Hyperlipidemia   . Tobacco abuse 10/30/2016   Past Surgical History:  Procedure Laterality Date  . ABDOMINAL AORTIC ANEURYSM REPAIR  2013  . CARDIAC CATHETERIZATION  2017  . TONSILLECTOMY AND ADENOIDECTOMY  1953     Family History  Problem Relation Age of Onset  . Hypertension Mother   . Cancer Father        MESOTHELIOMA  . Cancer Sister        LUNG  . Cancer Brother        LUNG    Social History   Social History Narrative   Moved from [place]   Retired Games developer   Lives with [place] History   Tobacco Use  . Smoking status: Former Smoker    Packs/day: 1.00    Years: 50.00    Pack years: 50.00    Types: Cigarettes    Quit date: 08/26/2017    Years since quitting: 2.7  . Smokeless tobacco: Never Used  Substance Use Topics  . Alcohol use: No    Current Meds  Medication Sig  . atorvastatin (LIPITOR) 40 MG tablet TAKE 1 TABLET BY MOUTH  DAILY  . cholecalciferol (VITAMIN D) 1000 units tablet Take 5,000 Units by mouth daily.  Marland Kitchen  umeclidinium-vilanterol (ANORO ELLIPTA) 62.5-25 MCG/INH AEPB Inhale 1 puff into the lungs daily.      Depression screen [facility] 2/9 02/22/2020 02/22/2020 09/09/2017 06/11/2017 10/30/2016  Decreased Interest 0 0 0 0 0  Down, Depressed, Hopeless 0 0 0 0 0  PHQ - 2 Score 0 0 0 0 0     Objective:   Today's Vitals: BP 136/70 (BP Location: Right Arm, Patient Position: Sitting, Cuff Size: Normal)   Pulse 65   Temp (!) 97.5 F (36.4 C) (Temporal)   Ht 5\' 9"  (1.753 m)   Wt 174 lb 6.4 oz (79.1 kg)   BMI 25.75 kg/m  Vitals with BMI 05/31/2020 03/08/2020 02/22/2020  Height 5\' 9"  5\' 9"  5\' 9"   Weight 174 lbs 6 oz 178 lbs 178 lbs 10 oz  BMI 25.74 68.34 19.62  Systolic 229 798 921  Diastolic 70 79 80  Pulse 65 73 73     Physical Exam   He looks systemically well.  Blood pressure is excellent for his age.  Weight is stable and he is lost in the 4 pounds since May of this year.  He is alert and orientated without any focal neurological signs.    Assessment  1. Hyperlipidemia, unspecified hyperlipidemia type   2. Vitamin D deficiency disease       Tests ordered No orders of the defined types were placed in this encounter.    Plan: 1. I recommended he discontinue statin therapy now and change his diet so he is eating less animal protein and more plant-based diet.  He will continue to do intermittent fasting every day. 2. He will continue with vitamin D3 supplementation as before and his vitamin D levels were in a good range last time. 3. Follow-up in 3 months and we will check blood work then.   No orders of the defined types were placed in this encounter.   Doree Albee, MD

## 2020-06-06 ENCOUNTER — Other Ambulatory Visit (INDEPENDENT_AMBULATORY_CARE_PROVIDER_SITE_OTHER): Payer: Self-pay | Admitting: Internal Medicine

## 2020-07-05 ENCOUNTER — Ambulatory Visit: Payer: Medicare Other | Attending: Internal Medicine

## 2020-07-05 DIAGNOSIS — Z23 Encounter for immunization: Secondary | ICD-10-CM

## 2020-07-05 NOTE — Progress Notes (Signed)
   Covid-19 Vaccination Clinic  Name:  David Rodriguez    MRN: [id number] DOB: August 23, 1946  07/05/2020  David Rodriguez was observed post Covid-19 immunization for 15 minutes without incident. He was provided with Vaccine Information Sheet and instruction to access the V-Safe system.   David Rodriguez was instructed to call 911 with any severe reactions post vaccine: Marland Kitchen Difficulty breathing  . Swelling of face and throat  . A fast heartbeat  . A bad rash all over body  . Dizziness and weakness

## 2020-07-20 ENCOUNTER — Ambulatory Visit (INDEPENDENT_AMBULATORY_CARE_PROVIDER_SITE_OTHER): Payer: Medicare Other

## 2020-07-20 ENCOUNTER — Other Ambulatory Visit: Payer: Self-pay

## 2020-07-20 DIAGNOSIS — Z23 Encounter for immunization: Secondary | ICD-10-CM

## 2020-07-20 NOTE — Progress Notes (Signed)
Pt given high dose flu; rt arm. Pt tolerated well; tolerated well. No complaints.

## 2020-08-31 DIAGNOSIS — L570 Actinic keratosis: Secondary | ICD-10-CM | POA: Diagnosis not present

## 2020-08-31 DIAGNOSIS — D485 Neoplasm of uncertain behavior of skin: Secondary | ICD-10-CM | POA: Diagnosis not present

## 2020-08-31 DIAGNOSIS — Z85828 Personal history of other malignant neoplasm of skin: Secondary | ICD-10-CM | POA: Diagnosis not present

## 2020-08-31 DIAGNOSIS — L720 Epidermal cyst: Secondary | ICD-10-CM | POA: Diagnosis not present

## 2020-08-31 DIAGNOSIS — D225 Melanocytic nevi of trunk: Secondary | ICD-10-CM | POA: Diagnosis not present

## 2020-09-05 ENCOUNTER — Other Ambulatory Visit: Payer: Self-pay

## 2020-09-05 ENCOUNTER — Encounter (INDEPENDENT_AMBULATORY_CARE_PROVIDER_SITE_OTHER): Payer: Self-pay | Admitting: Internal Medicine

## 2020-09-05 ENCOUNTER — Ambulatory Visit (INDEPENDENT_AMBULATORY_CARE_PROVIDER_SITE_OTHER): Payer: Medicare Other | Admitting: Internal Medicine

## 2020-09-05 VITALS — BP 144/82 | HR 75 | Temp 97.8°F | Ht 69.0 in | Wt 178.8 lb

## 2020-09-05 DIAGNOSIS — Z125 Encounter for screening for malignant neoplasm of prostate: Secondary | ICD-10-CM | POA: Diagnosis not present

## 2020-09-05 DIAGNOSIS — E785 Hyperlipidemia, unspecified: Secondary | ICD-10-CM | POA: Diagnosis not present

## 2020-09-05 DIAGNOSIS — E559 Vitamin D deficiency, unspecified: Secondary | ICD-10-CM

## 2020-09-05 NOTE — Progress Notes (Signed)
Metrics: Intervention Frequency ACO  Documented Smoking Status Yearly  Screened one or more times in 24 months  Cessation Counseling or  Active cessation medication Past 24 months  Past 24 months   Guideline developer: UpToDate (See UpToDate for funding source) Date Released: 2014       Wellness Office Visit  Subjective:  Patient ID: David Rodriguez, male    DOB: Jun 23, 1946  Age: 74 y.o. MRN: [id number]  CC: This very pleasant man comes in for follow-up of hyperlipidemia and vitamin D deficiency. HPI He had done so well with losing weight that we discontinued his statin therapy on the last visit so he has not been taking that.  He has been taking vitamin D3 5000 units daily. His last PSA was done in 2018 and he is okay with checking it now.  We had a discussion regarding this.  Past Medical History:  Diagnosis Date  . Allergy   . 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 Hyperlipidemia   . Tobacco abuse 10/30/2016   Past Surgical History:  Procedure Laterality Date  . ABDOMINAL AORTIC ANEURYSM REPAIR  2013  . CARDIAC CATHETERIZATION  2017  . TONSILLECTOMY AND ADENOIDECTOMY  1953     Family History  Problem Relation Age of Onset  . Hypertension Mother   . Cancer Father        MESOTHELIOMA  . Cancer Sister        LUNG  . Cancer Brother        LUNG    Social History   Social History Narrative   Moved from [place]   Retired Games developer   Lives with [place] History   Tobacco Use  . Smoking status: Former Smoker    Packs/day: 1.00    Years: 50.00    Pack years: 50.00    Types: Cigarettes    Quit date: 08/26/2017    Years since quitting: 3.0  . Smokeless tobacco: Never Used  Substance Use Topics  . Alcohol use: No    Current Meds  Medication Sig  . ANORO ELLIPTA 62.5-25 MCG/INH AEPB USE 1 INHALATION BY MOUTH  INTO THE LUNGS DAILY  . Cholecalciferol (VITAMIN D-3) 125 MCG (5000 UT) TABS Take 1 tablet by mouth daily.  . [DISCONTINUED] amoxicillin (AMOXIL) 500 MG  capsule Take 500 mg by mouth 3 (three) times daily. Used for dental  . [DISCONTINUED] cholecalciferol (VITAMIN D) 1000 units tablet Take 5,000 Units by mouth daily.      Depression screen [facility] 2/9 09/05/2020 02/22/2020 02/22/2020 09/09/2017 06/11/2017  Decreased Interest 0 0 0 0 0  Down, Depressed, Hopeless 0 0 0 0 0  PHQ - 2 Score 0 0 0 0 0  Altered sleeping 0 - - - -  Tired, decreased energy 0 - - - -  Change in appetite 0 - - - -  Feeling bad or failure about yourself  0 - - - -  Trouble concentrating 0 - - - -  Moving slowly or fidgety/restless 0 - - - -  Suicidal thoughts 0 - - - -  PHQ-9 Score 0 - - - -  Difficult doing work/chores Not difficult at all - - - -     Objective:   Today's Vitals: BP (!) 144/82   Pulse 75   Temp 97.8 F (36.6 C) (Temporal)   Ht 5\' 9"  (1.753 m)   Wt 178 lb 12.8 oz (81.1 kg)   SpO2 95%   BMI 26.40 kg/m  Vitals with  BMI 09/05/2020 05/31/2020 03/08/2020  Height 5\' 9"  5\' 9"  5\' 9"   Weight 178 lbs 13 oz 174 lbs 6 oz 178 lbs  BMI 26.39 23.53 61.44  Systolic 315 400 867  Diastolic 82 70 79  Pulse 75 65 73     Physical Exam   He looks systemically well.  His weight is stable.  Blood pressure acceptable for his age although systolic slightly elevated.  The last 1 was in a good range.  He is alert and orientated without any focal neurological signs.    Assessment   1. Hyperlipidemia, unspecified hyperlipidemia type   2. Vitamin D deficiency disease   3. Special screening for malignant neoplasm of prostate       Tests ordered Orders Placed This Encounter  Procedures  . Lipid panel  . PSA, Total with Reflex to PSA, Free     Plan: 1. Blood work is ordered for fasting lipid panel and PSA. 2. He will continue with vitamin D3 supplementation. 3. Follow-up with Judson Roch in 4 months time.   No orders of the defined types were placed in this encounter.   Doree Albee, MD

## 2020-09-06 LAB — LIPID PANEL
Cholesterol: 215 mg/dL — ABNORMAL HIGH (ref <200)
HDL: 52 mg/dL (ref >=40)
LDL Cholesterol (Calc): 143 mg/dL (calc) — ABNORMAL HIGH
Non-HDL Cholesterol (Calc): 163 mg/dL (calc) — ABNORMAL HIGH (ref <130)
Total CHOL/HDL Ratio: 4.1 (calc) (ref <5.0)
Triglycerides: 92 mg/dL (ref <150)

## 2020-09-06 LAB — PSA, TOTAL WITH REFLEX TO PSA, FREE: PSA, Total: 3.2 ng/mL (ref <=4.0)

## 2020-11-16 DIAGNOSIS — H52203 Unspecified astigmatism, bilateral: Secondary | ICD-10-CM | POA: Diagnosis not present

## 2020-11-16 DIAGNOSIS — H5203 Hypermetropia, bilateral: Secondary | ICD-10-CM | POA: Diagnosis not present

## 2020-11-16 DIAGNOSIS — H2513 Age-related nuclear cataract, bilateral: Secondary | ICD-10-CM | POA: Diagnosis not present

## 2021-01-04 ENCOUNTER — Ambulatory Visit (INDEPENDENT_AMBULATORY_CARE_PROVIDER_SITE_OTHER): Payer: Medicare Other | Admitting: Nurse Practitioner

## 2021-01-04 ENCOUNTER — Other Ambulatory Visit: Payer: Self-pay

## 2021-02-01 ENCOUNTER — Ambulatory Visit (INDEPENDENT_AMBULATORY_CARE_PROVIDER_SITE_OTHER): Payer: Medicare Other | Admitting: Nurse Practitioner

## 2021-02-01 ENCOUNTER — Other Ambulatory Visit: Payer: Self-pay

## 2021-02-01 ENCOUNTER — Telehealth (INDEPENDENT_AMBULATORY_CARE_PROVIDER_SITE_OTHER): Payer: Self-pay

## 2021-02-01 ENCOUNTER — Encounter (INDEPENDENT_AMBULATORY_CARE_PROVIDER_SITE_OTHER): Payer: Self-pay | Admitting: Nurse Practitioner

## 2021-02-01 VITALS — BP 144/72 | HR 81 | Temp 98.2°F | Ht 69.0 in | Wt 173.6 lb

## 2021-02-01 DIAGNOSIS — E785 Hyperlipidemia, unspecified: Secondary | ICD-10-CM | POA: Diagnosis not present

## 2021-02-01 DIAGNOSIS — R110 Nausea: Secondary | ICD-10-CM

## 2021-02-01 DIAGNOSIS — E559 Vitamin D deficiency, unspecified: Secondary | ICD-10-CM

## 2021-02-01 MED ORDER — ATORVASTATIN CALCIUM 40 MG PO TABS: 40.0000 mg | ORAL_TABLET | Freq: Every day | ORAL | 3 refills | Status: DC

## 2021-02-01 NOTE — Progress Notes (Signed)
Subjective:  Patient ID: David Rodriguez, male    DOB: 1946/02/09  Age: 75 y.o. MRN: [id number]  CC:  Chief Complaint  Patient presents with  . Follow-up    Doing well, has a few questions  . Hyperlipidemia  . Other    Nausea, Vitamin D deficiency      HPI  This patient arrives today for the above.  Hyperlipidemia: He has a history of hyperlipidemia and used to be on atorvastatin.  Last LDL was 143 this was collected a few months ago.  ASCVD risk was approximately 31.5%.  He tells me he stopped the atorvastatin but did not feel unwell on it in the past and would be willing to go back on this.  Nausea: He also reports some vague nausea that is happened 3 or 4 times over the last month.  It appears to happen after he eats a large meal.  He denies any vomiting or abdominal pain.  He does have a history of constipation and tells me he has bowel movement 1-2 times a week at the most.  He denies any dysphagia or heartburn.  Vitamin D deficiency: Last serum check showed level of 66 he continues on 5000 IUs of vitamin D3 daily.  Past Medical History:  Diagnosis Date  . Allergy   . COPD mixed type ([address]) 12/25/2017  . Hyperlipidemia   . Tobacco abuse 10/30/2016      Family History  Problem Relation Age of Onset  . Hypertension Mother   . Cancer Father        MESOTHELIOMA  . Cancer Sister        LUNG  . Cancer Brother        LUNG    Social History   Social History Narrative   Moved from [place]   Retired Games developer   Lives with [place] History   Tobacco Use  . Smoking status: Former Smoker    Packs/day: 1.00    Years: 50.00    Pack years: 50.00    Types: Cigarettes    Quit date: 08/26/2017    Years since quitting: 3.4  . Smokeless tobacco: Never Used  Substance Use Topics  . Alcohol use: No     Current Meds  Medication Sig  . ANORO ELLIPTA 62.5-25 MCG/INH AEPB USE 1 INHALATION BY MOUTH  INTO THE LUNGS DAILY  . atorvastatin (LIPITOR) 40 MG tablet  Take 1 tablet (40 mg total) by mouth daily.  . Cholecalciferol (VITAMIN D-3) 125 MCG (5000 UT) TABS Take 1 tablet by mouth daily.    ROS:  Review of Systems  Constitutional: Negative for malaise/fatigue.  Eyes: Negative for blurred vision.  Respiratory: Positive for shortness of breath (with strenuous activity).   Cardiovascular: Negative for chest pain.  Gastrointestinal: Positive for constipation and nausea. Negative for abdominal pain, diarrhea, heartburn and vomiting.  Neurological: Positive for dizziness (with fast position changes). Negative for headaches.     Objective:   Today's Vitals: BP (!) 144/72   Pulse 81   Temp 98.2 F (36.8 C) (Temporal)   Ht 5\' 9"  (1.753 m)   Wt 173 lb 9.6 oz (78.7 kg)   SpO2 94%   BMI 25.64 kg/m  Vitals with BMI 02/01/2021 09/05/2020 05/31/2020  Height 5\' 9"  5\' 9"  5\' 9"   Weight 173 lbs 10 oz 178 lbs 13 oz 174 lbs 6 oz  BMI 25.62 78.29 56.21  Systolic 308 657 846  Diastolic 72 82 70  Pulse  81 75 65     Physical Exam Vitals reviewed.  Constitutional:      Appearance: Normal appearance.  HENT:     Head: Normocephalic and atraumatic.  Cardiovascular:     Rate and Rhythm: Normal rate and regular rhythm.  Pulmonary:     Effort: Pulmonary effort is normal.     Breath sounds: Normal breath sounds.  Musculoskeletal:     Cervical back: Neck supple.  Skin:    General: Skin is warm and dry.  Neurological:     Mental Status: He is alert and oriented to person, place, and time.  Psychiatric:        Mood and Affect: Mood normal.        Behavior: Behavior normal.        Thought Content: Thought content normal.        Judgment: Judgment normal.          Assessment and Plan   1. Hyperlipidemia, unspecified hyperlipidemia type   2. Nausea   3. Vitamin D deficiency disease      Plan: 1.  After long discussion regarding risks versus benefits of statin therapy, he would like to restart this medication.  I will send prescription for  atorvastatin 40 mg by mouth daily to his pharmacy.  He will follow-up in about 6 weeks to recheck CMP and lipid panel. 2.  This appears to be very mild and vague at this time.  Etiology fairly unclear but may be related to his constipation.  We will trial him on Senokot-S as needed for constipation to see if this helps with the nausea.  He was told that in between now and his next office visit if his symptoms worsen in any way he needs to let me know.  We were to check metabolic panel today, however because we are doing blood work in 6 weeks and he feels the nausea is very mild that he would prefer to hold off and do all blood work at his next office visit.  I am agreeable to this unless symptoms worsen, and as stated before he knows to call me if symptoms worsen between now and then. 3.  We will check serum vitamin D level at next office visit.   Tests ordered No orders of the defined types were placed in this encounter.     Meds ordered this encounter  Medications  . atorvastatin (LIPITOR) 40 MG tablet    Sig: Take 1 tablet (40 mg total) by mouth daily.    Dispense:  90 tablet    Refill:  3    Order Specific Question:   Supervising Provider    Answer:   Doree Albee [[id number]]    Patient to follow-up in 6 weeks or sooner as needed.  Ailene Ards, NP

## 2021-02-01 NOTE — Patient Instructions (Signed)
Senna-S: Take 1-2 tablets by mouth daily for constipation

## 2021-02-01 NOTE — Telephone Encounter (Signed)
Patient called and left a detailed voice message asking if he needs to fast for his labs today at his appointment?   Please advise.

## 2021-02-01 NOTE — Telephone Encounter (Signed)
Called patient and gave him the message. Patient verbalized an understanding and thanked [place].

## 2021-02-01 NOTE — Telephone Encounter (Signed)
No, based on my chart prep notes he is not due for lipid panel so he does not need to fast today.

## 2021-02-15 ENCOUNTER — Ambulatory Visit (INDEPENDENT_AMBULATORY_CARE_PROVIDER_SITE_OTHER): Payer: Medicare Other | Admitting: Nurse Practitioner

## 2021-02-17 ENCOUNTER — Ambulatory Visit (HOSPITAL_COMMUNITY)
Admission: RE | Admit: 2021-02-17 | Discharge: 2021-02-17 | Disposition: A | Discharge status: Home / Self Care | Payer: Medicare Other | Source: Ambulatory Visit | Attending: Vascular Surgery | Admitting: Vascular Surgery

## 2021-02-17 ENCOUNTER — Other Ambulatory Visit: Payer: Self-pay

## 2021-02-17 ENCOUNTER — Ambulatory Visit: Payer: Medicare Other | Admitting: Vascular Surgery

## 2021-02-17 ENCOUNTER — Ambulatory Visit (INDEPENDENT_AMBULATORY_CARE_PROVIDER_SITE_OTHER)
Admission: RE | Admit: 2021-02-17 | Discharge: 2021-02-17 | Disposition: A | Discharge status: Home / Self Care | Payer: Medicare Other | Source: Ambulatory Visit | Attending: Vascular Surgery | Admitting: Vascular Surgery

## 2021-02-17 DIAGNOSIS — I714 Abdominal aortic aneurysm, without rupture, unspecified: Secondary | ICD-10-CM

## 2021-02-17 DIAGNOSIS — I724 Aneurysm of artery of lower extremity: Secondary | ICD-10-CM | POA: Insufficient documentation

## 2021-02-24 ENCOUNTER — Other Ambulatory Visit: Payer: Self-pay

## 2021-02-24 ENCOUNTER — Encounter: Payer: Self-pay | Admitting: Vascular Surgery

## 2021-02-24 ENCOUNTER — Ambulatory Visit: Payer: Medicare Other | Admitting: Vascular Surgery

## 2021-02-24 VITALS — BP 160/89 | HR 68 | Temp 98.3°F | Resp 20 | Ht 69.0 in | Wt 176.0 lb

## 2021-02-24 DIAGNOSIS — I714 Abdominal aortic aneurysm, without rupture, unspecified: Secondary | ICD-10-CM

## 2021-02-24 DIAGNOSIS — I724 Aneurysm of artery of lower extremity: Secondary | ICD-10-CM | POA: Diagnosis not present

## 2021-02-24 NOTE — Progress Notes (Signed)
Patient ID: David Rodriguez, male   DOB: 05-Sep-1946, 75 y.o.   MRN: [id number]  Reason for Consult: Follow-up   Referred by Doree Albee, MD  Subjective:     HPI:  David Rodriguez is a 75 y.o. male with a history of endovascular aneurysm repair in [place] in 2011.  Since that time he has done very well.  He transitioned here 3 years ago where we have followed his aortic aneurysm.  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  Patient is a former smoker no longer smokes is very active.  He does have a family history of aneurysm disease.  He has 3 daughters who are in their 7s who have not been checked but are lifelong non-smokers.  Past Medical History:  Diagnosis Date  . Allergy   . 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 Hyperlipidemia   . Tobacco abuse 10/30/2016   Family History  Problem Relation Age of Onset  . Hypertension Mother   . Cancer Father        MESOTHELIOMA  . Cancer Sister        LUNG  . Cancer Brother        LUNG   Past Surgical History:  Procedure Laterality Date  . ABDOMINAL AORTIC ANEURYSM REPAIR  2013  . CARDIAC CATHETERIZATION  2017  . TONSILLECTOMY AND ADENOIDECTOMY  1953    Short Social History:  Social History   Tobacco Use  . Smoking status: Former Smoker    Packs/day: 1.00    Years: 50.00    Pack years: 50.00    Types: Cigarettes    Quit date: 08/26/2017    Years since quitting: 3.5  . Smokeless tobacco: Never Used  Substance Use Topics  . Alcohol use: No    Allergies  Allergen Reactions  . Iodine Hives    Current Outpatient Medications  Medication Sig Dispense Refill  . ANORO ELLIPTA 62.5-25 MCG/INH AEPB USE 1 INHALATION BY MOUTH  INTO THE LUNGS DAILY 180 each 3  . atorvastatin (LIPITOR) 40 MG tablet Take 1 tablet (40 mg total) by mouth daily. 90 tablet 3  . Cholecalciferol (VITAMIN D-3) 125 MCG (5000 UT) TABS Take 1 tablet by mouth daily.    . sennosides-docusate sodium (SENOKOT-S) 8.6-50 MG tablet Take  1-2 tablets by mouth daily as needed for constipation.     No current facility-administered medications for this visit.    Review of Systems  Constitutional:  Constitutional negative. HENT: HENT negative.  Eyes: Eyes negative.  Respiratory: Respiratory negative.  Cardiovascular: Cardiovascular negative.  GI: Gastrointestinal negative.  Musculoskeletal: Musculoskeletal negative.  Skin: Skin negative.  Neurological: Neurological negative. Hematologic: Hematologic/lymphatic negative.  Psychiatric: Psychiatric negative.        Objective:  Objective   Vitals:   02/24/21 0834  BP: (!) 160/89  Pulse: 68  Resp: 20  Temp: 98.3 F (36.8 C)  SpO2: 93%  Weight: 176 lb (79.8 kg)  Height: 5\' 9"  (1.753 m)   Body mass index is 25.99 kg/m.  Physical Exam HENT:     Head: Normocephalic.     Nose:     Comments: Wearing a mask Eyes:     Pupils: Pupils are equal, round, and reactive to light.  Cardiovascular:     Rate and Rhythm: Normal rate.     Pulses:          Popliteal pulses are 3+ on the right side and 3+ on the left side.  Dorsalis pedis pulses are 2+ on the right side and 2+ on the left side.       Posterior tibial pulses are 2+ on the right side and 2+ on the left side.  Pulmonary:     Effort: Pulmonary effort is normal.     Breath sounds: Normal breath sounds.  Abdominal:     General: Abdomen is flat.     Palpations: Abdomen is soft. There is no mass.  Musculoskeletal:        General: Normal range of motion.     Cervical back: Normal range of motion.  Skin:    General: Skin is warm.     Capillary Refill: Capillary refill takes less than 2 seconds.  Neurological:     General: No focal deficit present.     Mental Status: He is alert.  Psychiatric:        Mood and Affect: Mood normal.        Behavior: Behavior normal.        Thought Content: Thought content normal.        Judgment: Judgment normal.      Data: +---------------+-------+-----------+--------+--------+-----+--------+  Right PoplitealAP (cm)Transv (cm)WaveformStenosisShapeComments  +---------------+-------+-----------+--------+--------+-----+--------+  Proximal    1.69  1.63    biphasic             +---------------+-------+-----------+--------+--------+-----+--------+  Mid      1.38  1.29    biphasic             +---------------+-------+-----------+--------+--------+-----+--------+  Distal     0.94  0.91    biphasic             +---------------+-------+-----------+--------+--------+-----+--------+   +--------------+-------+-----------+--------+--------+-----+--------+  Left PoplitealAP (cm)Transv (cm)WaveformStenosisShapeComments  +--------------+-------+-----------+--------+--------+-----+--------+  Proximal   1.23  1.21    biphasic             +--------------+-------+-----------+--------+--------+-----+--------+  Mid      0.94  1.01    biphasic             +--------------+-------+-----------+--------+--------+-----+--------+  Distal    0.70  0.68    biphasic             +--------------+-------+-----------+--------+--------+-----+--------+   Endovascular Aortic Repair (EVAR):  +----------+----------------+-------------------+--------------+       Diameter AP (cm)Diameter Trans (cm)Comments     +----------+----------------+-------------------+--------------+  Aorta   4.23      4.16                 +----------+----------------+-------------------+--------------+  Right Limb1.65      1.73                 +----------+----------------+-------------------+--------------+  Left Limb                   not visualized   +----------+----------------+-------------------+--------------+            Assessment/Plan:     75 year old male with history of domino aortic aneurysm repair in 2011 in [place].  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  Abdominal aortic aneurysm appears well sealed right popliteal artery aneurysm appears stable.  He will follow-up in 1 year with repeat studies.     Waynetta Sandy MD [facility]

## 2021-03-27 ENCOUNTER — Other Ambulatory Visit: Payer: Self-pay

## 2021-03-27 ENCOUNTER — Ambulatory Visit (INDEPENDENT_AMBULATORY_CARE_PROVIDER_SITE_OTHER): Payer: Medicare Other

## 2021-03-27 ENCOUNTER — Encounter (INDEPENDENT_AMBULATORY_CARE_PROVIDER_SITE_OTHER): Payer: Self-pay

## 2021-03-27 DIAGNOSIS — Z0000 Encounter for general adult medical examination without abnormal findings: Secondary | ICD-10-CM

## 2021-03-27 NOTE — Progress Notes (Signed)
Subjective:   David Rodriguez is a 75 y.o. male who presents for an Initial Medicare Annual Wellness Visit.  I connected with [name] today by telephone and verified that I am speaking with the correct person using two identifiers. Location patient: home Location provider: work Persons participating in the virtual visit: patient, provider.   I discussed the limitations, risks, security and privacy concerns of performing an evaluation and management service by telephone and the availability of in person appointments. I also discussed with the patient that there may be a patient responsible charge related to this service. The patient expressed understanding and verbally consented to this telephonic visit.    Interactive audio and video telecommunications were attempted between this provider and patient, however failed, due to patient having technical difficulties OR patient did not have access to video capability.  We continued and completed visit with audio only.     Review of Systems    N/A  Cardiac Risk Factors include: smoking/ tobacco exposure;male gender;advanced age (>5men, >77 women);dyslipidemia     Objective:    Today's Vitals   There is no height or weight on file to calculate BMI.  Advanced Directives 03/27/2021  Does Patient Have a Medical Advance Directive? Yes  Type of Paramedic of [place];Living will  Copy of [place] in Chart? No - copy requested    Current Medications (verified) Outpatient Encounter Medications as of 03/27/2021  Medication Sig   ANORO ELLIPTA 62.5-25 MCG/INH AEPB USE 1 INHALATION BY MOUTH  INTO THE LUNGS DAILY   atorvastatin (LIPITOR) 40 MG tablet Take 1 tablet (40 mg total) by mouth daily.   Cholecalciferol (VITAMIN D-3) 125 MCG (5000 UT) TABS Take 1 tablet by mouth daily.   sennosides-docusate sodium (SENOKOT-S) 8.6-50 MG tablet Take 1-2 tablets by mouth daily as needed for constipation.   No  facility-administered encounter medications on file as of 03/27/2021.    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   Hyperlipidemia    Tobacco abuse 10/30/2016   Past Surgical History:  Procedure Laterality Date   ABDOMINAL AORTIC ANEURYSM REPAIR  2013   CARDIAC CATHETERIZATION  2017   TONSILLECTOMY AND ADENOIDECTOMY  1953   Family History  Problem Relation Age of Onset   Hypertension Mother    Cancer Father        MESOTHELIOMA   Cancer Sister        LUNG   Cancer Brother        LUNG   Social History   Socioeconomic History   Marital status: Married    Spouse name: [name]   Number of children: 3   Years of education: 13   Highest education level: Not on file  Occupational History   Occupation: RETIRED    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  Tobacco Use   Smoking status: Former    Packs/day: 1.00    Years: 50.00    Pack years: 50.00    Types: Cigarettes    Quit date: 08/26/2017    Years since quitting: 3.5   Smokeless tobacco: Never  Vaping Use   Vaping Use: Never used  Substance and Sexual Activity   Alcohol use: No   Drug use: No   Sexual activity: Not Currently  Other Topics Concern   Not on file  Social History Narrative   Moved from [place]   Retired Games developer   Lives with  [name]   Social Determinants of Radio broadcast assistant Strain: Low Risk    Difficulty of Paying Living Expenses: Not hard at all  Food Insecurity: No Food Insecurity   Worried About Charity fundraiser in the Last Year: Never true   Arboriculturist in the Last Year: Never true  Transportation Needs: No Transportation Needs   Lack of Transportation (Medical): No   Lack of Transportation (Non-Medical): No  Physical Activity: Insufficiently Active   Days of Exercise per Week: 5 days   Minutes of Exercise per Session: 20 min  Stress: No Stress Concern Present   Feeling of Stress : Not at all  Social  Connections: Moderately Isolated   Frequency of Communication with Friends and Family: More than three times a week   Frequency of Social Gatherings with Friends and Family: More than three times a week   Attends Religious Services: Never   Marine scientist or Organizations: No   Attends Music therapist: Never   Marital Status: Married    Tobacco Counseling Counseling given: Not Answered   Clinical Intake:  Pre-visit preparation completed: Yes  Pain : No/denies pain     Nutritional Risks: None Diabetes: No  How often do you need to have someone help you when you read instructions, pamphlets, or other written materials from your doctor or pharmacy?: 1 - Never  Diabetic?No  Interpreter Needed?: No  Information entered by :: [place] of Daily Living In your present state of health, do you have any difficulty performing the following activities: 03/27/2021  Hearing? N  Vision? N  Difficulty concentrating or making decisions? N  Walking or climbing stairs? Y  Comment gets sob when climbing multiple stairs  Dressing or bathing? N  Doing errands, shopping? N  Preparing Food and eating ? N  Using the Toilet? N  In the past six months, have you accidently leaked urine? N  Do you have problems with loss of bowel control? N  Managing your Medications? N  Managing your Finances? N  Housekeeping or managing your Housekeeping? N  Some recent data might be hidden    Patient Care Team: Doree Albee, MD as PCP - General (Internal Medicine) Herminio Commons, MD (Inactive) as PCP - Cardiology (Cardiology)  Indicate any recent Medical Services you may have received from other than [facility] providers in the past year (date may be approximate).     Assessment:   This is a routine wellness examination for David Rodriguez.  Hearing/Vision screen Vision Screening - Comments:: Patient states gets eyes examined once per year. Currently wears glasses  for both reading and distance.   Dietary issues and exercise activities discussed: Current Exercise Habits: Home exercise routine, Type of exercise: walking (bicycle), Time (Minutes): 20, Frequency (Times/Week): 5, Weekly Exercise (Minutes/Week): 100, Intensity: Mild, Exercise limited by: respiratory conditions(s)   Goals Addressed             This Visit's Progress    Increase water intake   On track    STOP SMOKING   Not on track    Patient states has cut back on smoking. Currently smokes 4-5 packs per week        Depression Screen PHQ 2/9 Scores 03/27/2021 02/01/2021 09/05/2020 02/22/2020 02/22/2020 09/09/2017 06/11/2017  PHQ - 2 Score 0 0 0 0 0 0 0  PHQ- 9 Score 0 0 0 - - - -  Exception Documentation - - - - Medical  reason - -    Fall Risk Fall Risk  03/27/2021 02/01/2021 09/05/2020 03/08/2020 02/22/2020  Falls in the past year? 0 0 0 0 0  Number falls in past yr: 0 - - - 0  Injury with Fall? 0 - - - 0  Risk for fall due to : No Fall Risks - - - No Fall Risks  Follow up Falls evaluation completed;Falls prevention discussed - - Falls evaluation completed Falls evaluation completed    FALL RISK PREVENTION PERTAINING TO THE HOME:  Any stairs in or around the home? No  If so, are there any without handrails? No  Home free of loose throw rugs in walkways, pet beds, electrical cords, etc? Yes  Adequate lighting in your home to reduce risk of falls? Yes   ASSISTIVE DEVICES UTILIZED TO PREVENT FALLS:  Life alert? No  Use of a cane, walker or w/c? No  Grab bars in the bathroom? Yes  Shower chair or bench in shower? Yes  Elevated toilet seat or a handicapped toilet? Yes    Cognitive Function:  Normal cognitive status assessed by direct observation by this Nurse Health Advisor. No abnormalities found.        Immunizations Immunization History  Administered Date(s) Administered   Fluad Quad(high Dose 65+) 07/29/2019, 07/20/2020   Influenza-Unspecified 07/14/2018, 07/29/2019    Moderna Sars-Covid-2 Vaccination 01/13/2021   PFIZER(Purple Top)SARS-COV-2 Vaccination 11/19/2019, 12/15/2019, 07/05/2020   Pneumococcal Conjugate-13 08/03/2015   Pneumococcal Polysaccharide-23 02/22/2020   Td 10/15/2014    TDAP status: Up to date  Flu Vaccine status: Up to date  Pneumococcal vaccine status: Up to date  Covid-19 vaccine status: Completed vaccines  Qualifies for Shingles Vaccine? Yes   Zostavax completed No   Shingrix Completed?: No.    Education has been provided regarding the importance of this vaccine. Patient has been advised to call insurance company to determine out of pocket expense if they have not yet received this vaccine. Advised may also receive vaccine at local pharmacy or Health Dept. Verbalized acceptance and understanding.  Screening Tests Health Maintenance  Topic Date Due   Zoster Vaccines- Shingrix (1 of 2) Never done   INFLUENZA VACCINE  05/15/2021   TETANUS/TDAP  10/15/2024   COVID-19 Vaccine  Completed   Hepatitis C Screening  Completed   PNA vac Low Risk Adult  Completed   HPV VACCINES  Aged Out    Health Maintenance  Health Maintenance Due  Topic Date Due   Zoster Vaccines- Shingrix (1 of 2) Never done    Colorectal cancer screening: No longer required.   Lung Cancer Screening: (Low Dose CT Chest recommended if Age 62-80 years, 30 pack-year currently smoking OR have quit w/in 15years.) does qualify.   Lung Cancer Screening Referral: Patient would like to hold off until he sees he PCP on Wednesday 03/29/2021  Additional Screening:  Hepatitis C Screening: does qualify; Completed 06/03/2017  Vision Screening: Recommended annual ophthalmology exams for early detection of glaucoma and other disorders of the eye. Is the patient up to date with their annual eye exam?  Yes  Who is the provider or what is the name of the office in which the patient attends annual eye exams? Dr. Delman Cheadle  If pt is not established with a provider, would  they like to be referred to a provider to establish care? No .   Dental Screening: Recommended annual dental exams for proper oral hygiene  Community Resource Referral / Chronic Care Management: CRR required this visit?  No   CCM required this visit?  No      Plan:     I have personally reviewed and noted the following in the patient's chart:   Medical and social history Use of alcohol, tobacco or illicit drugs  Current medications and supplements including opioid prescriptions. Patient is not currently taking opioid prescriptions. Functional ability and status Nutritional status Physical activity Advanced directives List of other physicians Hospitalizations, surgeries, and ER visits in previous 12 months Vitals Screenings to include cognitive, depression, and falls Referrals and appointments  In addition, I have reviewed and discussed with patient certain preventive protocols, quality metrics, and best practice recommendations. A written personalized care plan for preventive services as well as general preventive health recommendations were provided to patient.     Ofilia Neas, LPN   01/22/7352   Nurse Notes: None

## 2021-03-27 NOTE — Patient Instructions (Addendum)
David Rodriguez , Thank you for taking time to come for your Medicare Wellness Visit. I appreciate your ongoing commitment to your health goals. Please review the following plan we discussed and let me know if I can assist you in the future.   Screening recommendations/referrals: Colonoscopy: No longer required  Recommended yearly ophthalmology/optometry visit for glaucoma screening and checkup Recommended yearly dental visit for hygiene and checkup  Vaccinations: Influenza vaccine: Up to date, next due fall 2022  Pneumococcal vaccine: Completed series  Tdap vaccine: Up to date, next due 10/15/2024 Shingles vaccine: Currently due, if you would like to receive we recommend that you do so at your local pharmacy     Advanced directives: Please bring in a copy of your advanced medical directives so that we may scan into your chart.  Conditions/risks identified: You qualify for CT Lung Cancer screening based on your smoking history. Please let us know if you would like for [place] to get this schedule for you.  Next appointment: 03/29/2021 @ 8am with Jeralyn Ruths, NP  Preventive Care 77 Years and Older, Male Preventive care refers to lifestyle choices and visits with your health care provider that can promote health and wellness. What does preventive care include? A yearly physical exam. This is also called an annual well check. Dental exams once or twice a year. Routine eye exams. Ask your health care provider how often you should have your eyes checked. Personal lifestyle choices, including: Daily care of your teeth and gums. Regular physical activity. Eating a healthy diet. Avoiding tobacco and drug use. Limiting alcohol use. Practicing safe sex. Taking low doses of aspirin every day. Taking vitamin and mineral supplements as recommended by your health care provider. What happens during an annual well check? The services and screenings done by your health care provider during your annual well  check will depend on your age, overall health, lifestyle risk factors, and family history of disease. Counseling  Your health care provider may ask you questions about your: Alcohol use. Tobacco use. Drug use. Emotional well-being. Home and relationship well-being. Sexual activity. Eating habits. History of falls. Memory and ability to understand (cognition). Work and work Statistician. Screening  You may have the following tests or measurements: Height, weight, and BMI. Blood pressure. Lipid and cholesterol levels. These may be checked every 5 years, or more frequently if you are over 77 years old. Skin check. Lung cancer screening. You may have this screening every year starting at age 77 if you have a 30-pack-year history of smoking and currently smoke or have quit within the past 15 years. Fecal occult blood test (FOBT) of the stool. You may have this test every year starting at age 77. Flexible sigmoidoscopy or colonoscopy. You may have a sigmoidoscopy every 5 years or a colonoscopy every 10 years starting at age 77. Prostate cancer screening. Recommendations will vary depending on your family history and other risks. Hepatitis C blood test. Hepatitis B blood test. Sexually transmitted disease (STD) testing. Diabetes screening. This is done by checking your blood sugar (glucose) after you have not eaten for a while (fasting). You may have this done every 1-3 years. Abdominal aortic aneurysm (AAA) screening. You may need this if you are a current or former smoker. Osteoporosis. You may be screened starting at age 77 if you are at high risk. Talk with your health care provider about your test results, treatment options, and if necessary, the need for more tests. Vaccines  Your health care provider may  recommend certain vaccines, such as: Influenza vaccine. This is recommended every year. Tetanus, diphtheria, and acellular pertussis (Tdap, Td) vaccine. You may need a Td booster  every 10 years. Zoster vaccine. You may need this after age 51. Pneumococcal 13-valent conjugate (PCV13) vaccine. One dose is recommended after age 77. Pneumococcal polysaccharide (PPSV23) vaccine. One dose is recommended after age 75. Talk to your health care provider about which screenings and vaccines you need and how often you need them. This information is not intended to replace advice given to you by your health care provider. Make sure you discuss any questions you have with your health care provider. Document Released: 10/28/2015 Document Revised: 06/20/2016 Document Reviewed: 08/02/2015 Elsevier Interactive Patient Education  2017 [place] Prevention in the Home Falls can cause injuries. They can happen to people of all ages. There are many things you can do to make your home safe and to help prevent falls. What can I do on the outside of my home? Regularly fix the edges of walkways and driveways and fix any cracks. Remove anything that might make you trip as you walk through a door, such as a raised step or threshold. Trim any bushes or trees on the path to your home. Use bright outdoor lighting. Clear any walking paths of anything that might make someone trip, such as rocks or tools. Regularly check to see if handrails are loose or broken. Make sure that both sides of any steps have handrails. Any raised decks and porches should have guardrails on the edges. Have any leaves, snow, or ice cleared regularly. Use sand or salt on walking paths during winter. Clean up any spills in your garage right away. This includes oil or grease spills. What can I do in the bathroom? Use night lights. Install grab bars by the toilet and in the tub and shower. Do not use towel bars as grab bars. Use non-skid mats or decals in the tub or shower. If you need to sit down in the shower, use a plastic, non-slip stool. Keep the floor dry. Clean up any water that spills on the floor as soon  as it happens. Remove soap buildup in the tub or shower regularly. Attach bath mats securely with double-sided non-slip rug tape. Do not have throw rugs and other things on the floor that can make you trip. What can I do in the bedroom? Use night lights. Make sure that you have a light by your bed that is easy to reach. Do not use any sheets or blankets that are too big for your bed. They should not hang down onto the floor. Have a firm chair that has side arms. You can use this for support while you get dressed. Do not have throw rugs and other things on the floor that can make you trip. What can I do in the kitchen? Clean up any spills right away. Avoid walking on wet floors. Keep items that you use a lot in easy-to-reach places. If you need to reach something above you, use a strong step stool that has a grab bar. Keep electrical cords out of the way. Do not use floor polish or wax that makes floors slippery. If you must use wax, use non-skid floor wax. Do not have throw rugs and other things on the floor that can make you trip. What can I do with my stairs? Do not leave any items on the stairs. Make sure that there are handrails on both sides of  the stairs and use them. Fix handrails that are broken or loose. Make sure that handrails are as long as the stairways. Check any carpeting to make sure that it is firmly attached to the stairs. Fix any carpet that is loose or worn. Avoid having throw rugs at the top or bottom of the stairs. If you do have throw rugs, attach them to the floor with carpet tape. Make sure that you have a light switch at the top of the stairs and the bottom of the stairs. If you do not have them, ask someone to add them for you. What else can I do to help prevent falls? Wear shoes that: Do not have high heels. Have rubber bottoms. Are comfortable and fit you well. Are closed at the toe. Do not wear sandals. If you use a stepladder: Make sure that it is fully  opened. Do not climb a closed stepladder. Make sure that both sides of the stepladder are locked into place. Ask someone to hold it for you, if possible. Clearly mark and make sure that you can see: Any grab bars or handrails. First and last steps. Where the edge of each step is. Use tools that help you move around (mobility aids) if they are needed. These include: Canes. Walkers. Scooters. Crutches. Turn on the lights when you go into a dark area. Replace any light bulbs as soon as they burn out. Set up your furniture so you have a clear path. Avoid moving your furniture around. If any of your floors are uneven, fix them. If there are any pets around you, be aware of where they are. Review your medicines with your doctor. Some medicines can make you feel dizzy. This can increase your chance of falling. Ask your doctor what other things that you can do to help prevent falls. This information is not intended to replace advice given to you by your health care provider. Make sure you discuss any questions you have with your health care provider. Document Released: 07/28/2009 Document Revised: 03/08/2016 Document Reviewed: 11/05/2014 Elsevier Interactive Patient Education  2017 [employer].

## 2021-03-29 ENCOUNTER — Encounter (INDEPENDENT_AMBULATORY_CARE_PROVIDER_SITE_OTHER): Payer: Self-pay | Admitting: Nurse Practitioner

## 2021-03-29 ENCOUNTER — Other Ambulatory Visit: Payer: Self-pay

## 2021-03-29 ENCOUNTER — Ambulatory Visit (INDEPENDENT_AMBULATORY_CARE_PROVIDER_SITE_OTHER): Payer: Medicare Other | Admitting: Nurse Practitioner

## 2021-03-29 VITALS — BP 128/74 | HR 73 | Temp 97.7°F | Ht 69.0 in | Wt 173.8 lb

## 2021-03-29 DIAGNOSIS — E559 Vitamin D deficiency, unspecified: Secondary | ICD-10-CM | POA: Diagnosis not present

## 2021-03-29 DIAGNOSIS — R110 Nausea: Secondary | ICD-10-CM

## 2021-03-29 DIAGNOSIS — E785 Hyperlipidemia, unspecified: Secondary | ICD-10-CM

## 2021-03-29 NOTE — Progress Notes (Signed)
Subjective:  Patient ID: David Rodriguez, male    DOB: [birth year]  Age: 75 y.o. MRN: [id number]  CC:  Chief Complaint  Patient presents with   Follow-up    Doing well, no concerns   Hyperlipidemia   Abdominal Pain   Other    Vitamin D deficiency, concerns of enlarging area on his neck      HPI  This patient arrives today for the above.  Hyperlipidemia: We will restart his atorvastatin last office visit.  Most recent ASCVD risk score is approximately 31.5%.  Last LDL was 143.  He is here today to have blood work rechecked to monitor kidney function and liver enzymes.  He tells me overall he is tolerating medication well and is not experiencing any negative side effects.  Abdominal pain/nausea: He was expressing some nausea and constipation as well as some mild abdominal pain at previous visit.  I had recommended he try over-the-counter senna as needed for constipation.  He tells me he did take 1 dose which seemed to helped treat his constipation.  Since then, he has not experienced any returning symptoms.  Vitamin D deficiency: He continues on vitamin D3 supplement.  He is due to have serum level checked today.  Last serum level was collected about 1 year ago and it was 66.  Neck enlargement: He is to have a full beard but recently shaved this off.  He tells me when shaving he felt that the area above his Adam's apple was more prominent and enlarged.  He would like this to be evaluated.  He denies any difficulty or pain with swallowing.  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   Hyperlipidemia    Tobacco abuse 10/30/2016      Family History  Problem Relation Age of Onset   Hypertension Mother    Cancer Father        MESOTHELIOMA   Cancer Sister        LUNG   Cancer Brother        LUNG    Social History   Social History Narrative   Moved from [place]   Retired Games developer   Lives with [place] History   Tobacco  Use   Smoking status: Former    Packs/day: 1.00    Years: 50.00    Pack years: 50.00    Types: Cigarettes    Quit date: 08/26/2017    Years since quitting: 3.5   Smokeless tobacco: Never  Substance Use Topics   Alcohol use: No     Current Meds  Medication Sig   ANORO ELLIPTA 62.5-25 MCG/INH AEPB USE 1 INHALATION BY MOUTH  INTO THE LUNGS DAILY   atorvastatin (LIPITOR) 40 MG tablet Take 1 tablet (40 mg total) by mouth daily.   Cholecalciferol (VITAMIN D-3) 125 MCG (5000 UT) TABS Take 1 tablet by mouth daily.   sennosides-docusate sodium (SENOKOT-S) 8.6-50 MG tablet Take 1-2 tablets by mouth daily as needed for constipation.    ROS:  Review of Systems  Constitutional:  Negative for malaise/fatigue.  Eyes:  Negative for blurred vision.  Respiratory:  Negative for shortness of breath.   Cardiovascular:  Negative for chest pain.  Gastrointestinal:  Negative for abdominal pain, blood in stool, constipation and nausea.  Neurological:  Positive for sensory change. Negative for dizziness and headaches.    Objective:   Today's Vitals: BP 128/74   Pulse 73  Temp 97.7 F (36.5 C) (Temporal)   Ht 5' 9"  (1.753 m)   Wt 173 lb 12.8 oz (78.8 kg)   SpO2 94%   BMI 25.67 kg/m  Vitals with BMI 03/29/2021 03/27/2021 02/24/2021  Height 5' 9"  (No Data) 5' 9"   Weight 173 lbs 13 oz (No Data) 176 lbs  BMI 38.25 - 05.39  Systolic 767 (No Data) 341  Diastolic 74 (No Data) 89  Pulse 73 (No Data) 68     Physical Exam Vitals reviewed.  Constitutional:      Appearance: Normal appearance.  HENT:     Head: Normocephalic and atraumatic.  Neck:     Thyroid: No thyroid mass, thyromegaly or thyroid tenderness.  Cardiovascular:     Rate and Rhythm: Normal rate and regular rhythm.  Pulmonary:     Effort: Pulmonary effort is normal.     Breath sounds: Normal breath sounds.  Musculoskeletal:     Cervical back: Neck supple.  Skin:    General: Skin is warm and dry.  Neurological:     Mental  Status: He is alert and oriented to person, place, and time.  Psychiatric:        Mood and Affect: Mood normal.        Behavior: Behavior normal.        Thought Content: Thought content normal.        Judgment: Judgment normal.         Assessment and Plan   1. Vitamin D deficiency disease   2. Hyperlipidemia, unspecified hyperlipidemia type   3. Nausea      Plan: 1.  We will check serum vitamin D level for further evaluation today. 2.  We will check CMP and lipid panel for further evaluation today.  He will continue on his atorvastatin as prescribed. 3.  This seems to have resolved, he was encouraged let me know if symptoms return.   Of note, I did not note any abnormalities (no thyromegaly, no nodules, no obvious masses) on physical exam on the patient's neck.  I think what he is feeling is just normal anatomy.  However, I told him if he feels the area of concern continues to enlarge that he should notify me at which time we could send him for ultrasound.  He is agreeable to this plan.   Tests ordered Orders Placed This Encounter  Procedures   Lipid Panel   CMP with eGFR([facility])   Vitamin D, 25-hydroxy      No orders of the defined types were placed in this encounter.   Patient to follow-up in 3 months or sooner as needed.  Ailene Ards, NP

## 2021-03-30 LAB — COMPLETE METABOLIC PANEL WITH GFR
AG Ratio: 1.8 (calc) (ref 1.0–2.5)
ALT: 11 U/L (ref 9–46)
AST: 11 U/L (ref 10–35)
Albumin: 4.4 g/dL (ref 3.6–5.1)
Alkaline phosphatase (APISO): 66 U/L (ref 35–144)
BUN: 13 mg/dL (ref 7–25)
CO2: 25 mmol/L (ref 20–32)
Calcium: 9.4 mg/dL (ref 8.6–10.3)
Chloride: 105 mmol/L (ref 98–110)
Creat: 0.8 mg/dL (ref 0.70–1.18)
GFR, Est African American: 101 mL/min/{1.73_m2} (ref >=60)
GFR, Est Non African American: 87 mL/min/{1.73_m2} (ref >=60)
Globulin: 2.4 g/dL (calc) (ref 1.9–3.7)
Glucose, Bld: 82 mg/dL (ref 65–99)
Potassium: 4.4 mmol/L (ref 3.5–5.3)
Sodium: 138 mmol/L (ref 135–146)
Total Bilirubin: 1.1 mg/dL (ref 0.2–1.2)
Total Protein: 6.8 g/dL (ref 6.1–8.1)

## 2021-03-30 LAB — LIPID PANEL
Cholesterol: 150 mg/dL (ref <200)
HDL: 53 mg/dL (ref >=40)
LDL Cholesterol (Calc): 82 mg/dL (calc)
Non-HDL Cholesterol (Calc): 97 mg/dL (calc) (ref <130)
Total CHOL/HDL Ratio: 2.8 (calc) (ref <5.0)
Triglycerides: 70 mg/dL (ref <150)

## 2021-03-30 LAB — VITAMIN D 25 HYDROXY (VIT D DEFICIENCY, FRACTURES): Vit D, 25-Hydroxy: 64 ng/mL (ref 30–100)

## 2021-05-22 ENCOUNTER — Encounter (INDEPENDENT_AMBULATORY_CARE_PROVIDER_SITE_OTHER): Payer: Self-pay | Admitting: Nurse Practitioner

## 2021-06-28 ENCOUNTER — Ambulatory Visit (INDEPENDENT_AMBULATORY_CARE_PROVIDER_SITE_OTHER): Payer: Medicare Other | Admitting: Nurse Practitioner

## 2021-07-04 ENCOUNTER — Encounter: Payer: Self-pay | Admitting: Nurse Practitioner

## 2021-07-04 ENCOUNTER — Other Ambulatory Visit: Payer: Self-pay

## 2021-07-04 ENCOUNTER — Ambulatory Visit (INDEPENDENT_AMBULATORY_CARE_PROVIDER_SITE_OTHER): Payer: Medicare Other | Admitting: Nurse Practitioner

## 2021-07-04 VITALS — BP 149/82 | HR 74 | Temp 98.1°F | Ht 69.0 in | Wt 173.0 lb

## 2021-07-04 DIAGNOSIS — E785 Hyperlipidemia, unspecified: Secondary | ICD-10-CM | POA: Diagnosis not present

## 2021-07-04 DIAGNOSIS — J449 Chronic obstructive pulmonary disease, unspecified: Secondary | ICD-10-CM

## 2021-07-04 DIAGNOSIS — Z23 Encounter for immunization: Secondary | ICD-10-CM

## 2021-07-04 DIAGNOSIS — I714 Abdominal aortic aneurysm, without rupture, unspecified: Secondary | ICD-10-CM

## 2021-07-04 DIAGNOSIS — Z7689 Persons encountering health services in other specified circumstances: Secondary | ICD-10-CM

## 2021-07-04 DIAGNOSIS — Z720 Tobacco use: Secondary | ICD-10-CM

## 2021-07-04 DIAGNOSIS — I724 Aneurysm of artery of lower extremity: Secondary | ICD-10-CM | POA: Diagnosis not present

## 2021-07-04 DIAGNOSIS — R030 Elevated blood-pressure reading, without diagnosis of hypertension: Secondary | ICD-10-CM

## 2021-07-04 DIAGNOSIS — I10 Essential (primary) hypertension: Secondary | ICD-10-CM | POA: Insufficient documentation

## 2021-07-04 NOTE — Progress Notes (Signed)
New Patient Office Visit  Subjective:  Patient ID: David Rodriguez, male    DOB: May 11, 1946  Age: 75 y.o. MRN: [id number]  CC:  Chief Complaint  Patient presents with   New Patient (Initial Visit)    Here to establish care. No complaints today.    HPI David Rodriguez presents for new patient visit. Transferring care from [facility]. Last physical/AWV was completed 03/27/21. Last labs were drawn 03/29/21.  Followed by Dr. Tarri Glenn for hx of skin cancer.  He states that he feels a lump in his throat and it is making it hard to swallow. He can feel it near his Adam's apple.  No longer followed by pulmonology for COPD. He states he started smoking again.  He is followed by Dr. 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   Hyperlipidemia    Tobacco abuse 10/30/2016    Past Surgical History:  Procedure Laterality Date   ABDOMINAL AORTIC ANEURYSM REPAIR  2013   CARDIAC CATHETERIZATION  2017   TONSILLECTOMY AND ADENOIDECTOMY  1953    Family History  Problem Relation Age of Onset   Hypertension Mother    Cancer Father        MESOTHELIOMA   Cancer Sister        LUNG   Cancer Brother        LUNG    Social History   Socioeconomic History   Marital status: Married    Spouse name: [name]   Number of children: 3   Years of education: 13   Highest education level: Not on file  Occupational History   Occupation: RETIRED    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  Tobacco Use   Smoking status: Every Day    Packs/day: 1.00    Years: 50.00    Pack years: 50.00    Types: Cigarettes    Last attempt to quit: 08/26/2017    Years since quitting: 3.8   Smokeless tobacco: Never  Vaping Use   Vaping Use: Never used  Substance and Sexual Activity   Alcohol use: No   Drug use: No   Sexual activity: Not Currently  Other Topics Concern   Not on file  Social History Narrative   Moved from [place]    Retired Games developer   Lives with [place] Determinants of Health   Financial Resource Strain: Low Risk    Difficulty of Paying Living Expenses: Not hard at all  Food Insecurity: No Food Insecurity   Worried About Charity fundraiser in the Last Year: Never true   Arboriculturist in the Last Year: Never true  Transportation Needs: No Transportation Needs   Lack of Transportation (Medical): No   Lack of Transportation (Non-Medical): No  Physical Activity: Insufficiently Active   Days of Exercise per Week: 5 days   Minutes of Exercise per Session: 20 min  Stress: No Stress Concern Present   Feeling of Stress : Not at all  Social Connections: Moderately Isolated   Frequency of Communication with Friends and Family: More than three times a week   Frequency of Social Gatherings with Friends and Family: More than three times a week   Attends Religious Services: Never   Marine scientist or Organizations: No   Attends Archivist Meetings: Never   Marital Status: Married  Human resources officer Violence: Not At Risk   Fear  of Current or Ex-Partner: No   Emotionally Abused: No   Physically Abused: No   Sexually Abused: No    ROS Review of Systems  Constitutional: Negative.   Respiratory: Negative.    Cardiovascular: Negative.   Musculoskeletal: Negative.   Psychiatric/Behavioral: Negative.     Objective:   Today's Vitals: BP (!) 149/82 (BP Location: Left Arm, Patient Position: Sitting, Cuff Size: Large)   Pulse 74   Temp 98.1 F (36.7 C) (Oral)   Ht _0  (1.753 m)   Wt 173 lb (78.5 kg)   SpO2 94%   BMI 25.55 kg/m   Physical Exam Constitutional:      Appearance: Normal appearance.  Cardiovascular:     Rate and Rhythm: Normal rate and regular rhythm.  Pulmonary:     Effort: Pulmonary effort is normal.     Breath sounds: Normal breath sounds.  Musculoskeletal:        General: Normal range of motion.     Cervical back: Normal range of motion and neck  supple.  Neurological:     Mental Status: He is alert.  Psychiatric:        Mood and Affect: Mood normal.        Behavior: Behavior normal.        Thought Content: Thought content normal.        Judgment: Judgment normal.    Assessment & Plan:   Problem List Items Addressed This Visit       Cardiovascular and Mediastinum   AAA (abdominal aortic aneurysm) without rupture ([place])    -followed by vascular surgery, Dr. 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 Donzetta Matters        Respiratory   COPD GOLD III     -takes anoro -no longer followed by pulm -started smoking again ->50 pk-yr history        Other   Tobacco abuse    -still smoking      Hyperlipidemia    -check lipids with next lab draw -takes atorvastatin      Relevant Orders   CBC with Differential/Platelet   CMP14+EGFR   Lipid Panel With LDL/HDL Ratio   Establishing care with new doctor, encounter for - Primary    -records available in Epic from Dr. Anastasio Champion      Relevant Orders   CBC with Differential/Platelet   CMP14+EGFR   Lipid Panel With LDL/HDL Ratio   Elevated BP without diagnosis of hypertension    BP Readings from Last 3 Encounters:  07/04/21 (!) 149/82  03/29/21 128/74  02/24/21 (!) 160/89  -monitor again at next OV -if elevated, will consider adding medication      Relevant Orders   CBC with Differential/Platelet   CMP14+EGFR   Lipid Panel With LDL/HDL Ratio    Outpatient Encounter Medications as of 07/04/2021  Medication Sig   ANORO ELLIPTA 62.5-25 MCG/INH AEPB USE 1 INHALATION BY MOUTH  INTO THE LUNGS DAILY   atorvastatin (LIPITOR) 40 MG tablet Take 1 tablet (40 mg total) by mouth daily.   Cholecalciferol (VITAMIN D-3) 125 MCG (5000 UT) TABS Take 1 tablet by mouth daily.   sennosides-docusate sodium (SENOKOT-S) 8.6-50 MG tablet Take 1-2 tablets by mouth daily as needed for constipation.   No facility-administered encounter medications on file as of 07/04/2021.     Follow-up: Return in about 2 months (around 09/03/2021) for Lab follow-up (HLD).   Noreene Larsson, NP

## 2021-07-04 NOTE — Assessment & Plan Note (Signed)
-  followed by vascular surgery, Dr. Donzetta Matters

## 2021-07-04 NOTE — Patient Instructions (Signed)
Please have fasting labs drawn 2-3 days prior to your appointment so we can discuss the results during your office visit.  

## 2021-07-04 NOTE — Assessment & Plan Note (Signed)
-  check lipids with next lab draw -takes atorvastatin

## 2021-07-04 NOTE — Assessment & Plan Note (Signed)
-  still smoking

## 2021-07-04 NOTE — Assessment & Plan Note (Signed)
BP Readings from Last 3 Encounters:  07/04/21 (!) 149/82  03/29/21 128/74  02/24/21 (!) 160/89   -monitor again at next OV -if elevated, will consider adding medication

## 2021-07-04 NOTE — Assessment & Plan Note (Signed)
-  records available in [place] from Dr. Anastasio Champion

## 2021-07-04 NOTE — Assessment & Plan Note (Signed)
-  takes anoro -no longer followed by pulm -started smoking again ->50 pk-yr history

## 2021-07-04 NOTE — Assessment & Plan Note (Signed)
-  followed by Dr. Donzetta Matters

## 2021-07-10 ENCOUNTER — Encounter: Payer: Self-pay | Admitting: Nurse Practitioner

## 2021-07-10 NOTE — Telephone Encounter (Signed)
Added to immunization record

## 2021-08-24 ENCOUNTER — Telehealth: Payer: Medicare Other | Admitting: Emergency Medicine

## 2021-08-24 DIAGNOSIS — J069 Acute upper respiratory infection, unspecified: Secondary | ICD-10-CM

## 2021-08-24 MED ORDER — ALBUTEROL SULFATE HFA 108 (90 BASE) MCG/ACT IN AERS: 2.0000 | INHALATION_SPRAY | RESPIRATORY_TRACT | 0 refills | Status: DC | PRN

## 2021-08-24 MED ORDER — AZITHROMYCIN 250 MG PO TABS: ORAL_TABLET | ORAL | 0 refills | Status: DC

## 2021-08-24 MED ORDER — BENZONATATE 100 MG PO CAPS: 100.0000 mg | ORAL_CAPSULE | Freq: Two times a day (BID) | ORAL | 0 refills | Status: DC | PRN

## 2021-08-24 NOTE — Progress Notes (Signed)
We are sorry that you are not feeling well.  Here is how we plan to help!  Based on your presentation I believe you most likely have A cough due to bacteria.  When patients have a fever and a productive cough with a change in color or increased sputum production, we are concerned about bacterial bronchitis.  If left untreated it can progress to pneumonia.  If your symptoms do not improve with your treatment plan it is important that you contact your provider.   I have prescribed Azithromyin 250 mg: two tablets now and then one tablet daily for 4 additonal days    In addition you may use A prescription cough medication called Tessalon Perles 100mg . You may take 1-2 capsules every 8 hours as needed for your cough.  I've also sent an inhaler to use.   From your responses in the eVisit questionnaire you describe inflammation in the upper respiratory tract which is causing a significant cough.  This is commonly called Bronchitis and has four common causes:   Allergies Viral Infections Acid Reflux Bacterial Infection Allergies, viruses and acid reflux are treated by controlling symptoms or eliminating the cause. An example might be a cough caused by taking certain blood pressure medications. You stop the cough by changing the medication. Another example might be a cough caused by acid reflux. Controlling the reflux helps control the cough.  USE OF BRONCHODILATOR ("RESCUE") INHALERS: There is a risk from using your bronchodilator too frequently.  The risk is that over-reliance on a medication which only relaxes the muscles surrounding the breathing tubes can reduce the effectiveness of medications prescribed to reduce swelling and congestion of the tubes themselves.  Although you feel brief relief from the bronchodilator inhaler, your asthma may actually be worsening with the tubes becoming more swollen and filled with mucus.  This can delay other crucial treatments, such as oral steroid medications. If  you need to use a bronchodilator inhaler daily, several times per day, you should discuss this with your provider.  There are probably better treatments that could be used to keep your asthma under control.     HOME CARE Only take medications as instructed by your medical team. Complete the entire course of an antibiotic. Drink plenty of fluids and get plenty of rest. Avoid close contacts especially the very young and the elderly Cover your mouth if you cough or cough into your sleeve. Always remember to wash your hands A steam or ultrasonic humidifier can help congestion.   GET HELP RIGHT AWAY IF: You develop worsening fever. You become short of breath You cough up blood. Your symptoms persist after you have completed your treatment plan MAKE SURE YOU  Understand these instructions. Will watch your condition. Will get help right away if you are not doing well or get worse.    Thank you for choosing an e-visit.  Your e-visit answers were reviewed by a board certified advanced clinical practitioner to complete your personal care plan. Depending upon the condition, your plan could have included both over the counter or prescription medications.  Please review your pharmacy choice. Make sure the pharmacy is open so you can pick up prescription now. If there is a problem, you may contact your provider through [employer] and have the prescription routed to another pharmacy.  Your safety is important to [place]. If you have drug allergies check your prescription carefully.   For the next 24 hours you can use MyChart to ask questions about today's  visit, request a non-urgent call back, or ask for a work or school excuse. You will get an email in the next two days asking about your experience. I hope that your e-visit has been valuable and will speed your recovery.  Approximately 5 minutes was used in reviewing the patient's chart, questionnaire, prescribing medications, and  documentation.

## 2021-09-02 LAB — LIPID PANEL WITH LDL/HDL RATIO
Cholesterol, Total: 150 mg/dL (ref 100–199)
HDL: 43 mg/dL (ref >39)
LDL Chol Calc (NIH): 88 mg/dL (ref 0–99)
LDL/HDL Ratio: 2 ratio (ref 0.0–3.6)
Triglycerides: 103 mg/dL (ref 0–149)
VLDL Cholesterol Cal: 19 mg/dL (ref 5–40)

## 2021-09-02 LAB — CBC WITH DIFFERENTIAL/PLATELET
Basophils Absolute: 0.1 10*3/uL (ref 0.0–0.2)
Basos: 1 %
EOS (ABSOLUTE): 0.1 10*3/uL (ref 0.0–0.4)
Eos: 2 %
Hematocrit: 48.8 % (ref 37.5–51.0)
Hemoglobin: 16.4 g/dL (ref 13.0–17.7)
Immature Grans (Abs): 0 10*3/uL (ref 0.0–0.1)
Immature Granulocytes: 0 %
Lymphocytes Absolute: 2 10*3/uL (ref 0.7–3.1)
Lymphs: 31 %
MCH: 30 pg (ref 26.6–33.0)
MCHC: 33.6 g/dL (ref 31.5–35.7)
MCV: 89 fL (ref 79–97)
Monocytes Absolute: 0.6 10*3/uL (ref 0.1–0.9)
Monocytes: 9 %
Neutrophils Absolute: 3.7 10*3/uL (ref 1.4–7.0)
Neutrophils: 57 %
Platelets: 256 10*3/uL (ref 150–450)
RBC: 5.47 x10E6/uL (ref 4.14–5.80)
RDW: 13.4 % (ref 11.6–15.4)
WBC: 6.5 10*3/uL (ref 3.4–10.8)

## 2021-09-02 LAB — CMP14+EGFR
ALT: 14 IU/L (ref 0–44)
AST: 15 IU/L (ref 0–40)
Albumin/Globulin Ratio: 1.6 (ref 1.2–2.2)
Albumin: 4.1 g/dL (ref 3.7–4.7)
Alkaline Phosphatase: 84 IU/L (ref 44–121)
BUN/Creatinine Ratio: 18 (ref 10–24)
BUN: 17 mg/dL (ref 8–27)
Bilirubin Total: 1 mg/dL (ref 0.0–1.2)
CO2: 25 mmol/L (ref 20–29)
Calcium: 9.3 mg/dL (ref 8.6–10.2)
Chloride: 103 mmol/L (ref 96–106)
Creatinine, Ser: 0.96 mg/dL (ref 0.76–1.27)
Globulin, Total: 2.5 g/dL (ref 1.5–4.5)
Glucose: 80 mg/dL (ref 70–99)
Potassium: 4.6 mmol/L (ref 3.5–5.2)
Sodium: 141 mmol/L (ref 134–144)
Total Protein: 6.6 g/dL (ref 6.0–8.5)
eGFR: 81 mL/min/{1.73_m2} (ref >59)

## 2021-09-04 ENCOUNTER — Encounter: Payer: Self-pay | Admitting: Internal Medicine

## 2021-09-04 ENCOUNTER — Other Ambulatory Visit: Payer: Self-pay

## 2021-09-04 ENCOUNTER — Ambulatory Visit (INDEPENDENT_AMBULATORY_CARE_PROVIDER_SITE_OTHER): Payer: Medicare Other | Admitting: Internal Medicine

## 2021-09-04 VITALS — BP 142/64 | HR 85 | Resp 18 | Ht 69.0 in | Wt 178.1 lb

## 2021-09-04 DIAGNOSIS — K5900 Constipation, unspecified: Secondary | ICD-10-CM | POA: Insufficient documentation

## 2021-09-04 DIAGNOSIS — I10 Essential (primary) hypertension: Secondary | ICD-10-CM | POA: Diagnosis not present

## 2021-09-04 DIAGNOSIS — J449 Chronic obstructive pulmonary disease, unspecified: Secondary | ICD-10-CM | POA: Diagnosis not present

## 2021-09-04 MED ORDER — ALBUTEROL SULFATE HFA 108 (90 BASE) MCG/ACT IN AERS: 2.0000 | INHALATION_SPRAY | Freq: Four times a day (QID) | RESPIRATORY_TRACT | 5 refills | Status: DC | PRN

## 2021-09-04 MED ORDER — LOSARTAN POTASSIUM 25 MG PO TABS: 25.0000 mg | ORAL_TABLET | Freq: Every day | ORAL | 0 refills | Status: DC

## 2021-09-04 NOTE — Patient Instructions (Signed)
Please start taking Losartan for BP.  Please continue to follow low salt diet and ambulate as tolerated.  Please try to cut down -> quit smoking.

## 2021-09-05 ENCOUNTER — Ambulatory Visit: Payer: Medicare Other | Admitting: Nurse Practitioner

## 2021-09-07 NOTE — Assessment & Plan Note (Signed)
Well-controlled with Anoro Had not been using albuterol, refilled Explained use of maintenance inhaler regularly and rescue inhaler when needed only

## 2021-09-07 NOTE — Assessment & Plan Note (Signed)
Senokot as needed

## 2021-09-07 NOTE — Assessment & Plan Note (Addendum)
BP Readings from Last 1 Encounters:  09/04/21 (!) 142/64   Elevated today Chart review suggests elevated BP during previous office visits Started losartan 25 mg daily Counseled for compliance with the medications Advised DASH diet and moderate exercise/walking, at least 150 mins/week

## 2021-09-07 NOTE — Progress Notes (Signed)
Established Patient Office Visit  Subjective:  Patient ID: David Rodriguez, male    DOB: 10/08/1946  Age: 75 y.o. MRN: [id number]  CC:  Chief Complaint  Patient presents with   Follow-up    2 month follow up     HPI David Rodriguez is a 75 y.o. male with past medical history of CAD, HTN, COPD and tobacco abuse who presents for f/u of his chronic medical conditions.  HTN: His BP was slightly elevated in the office today.  His BP has been higher than today during previous office visits according to chart review.  He denies any headache, dizziness, chest pain, or palpitations currently.  COPD: He has been using Anoro Ellipta now.  He had not been using albuterol since he started using Anoro.  I explained the different use of Anoro and albuterol for COPD, he expressed understanding.  He has chronic constipation, for which she takes Senokot.  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   Hyperlipidemia    Tobacco abuse 10/30/2016    Past Surgical History:  Procedure Laterality Date   ABDOMINAL AORTIC ANEURYSM REPAIR  2013   CARDIAC CATHETERIZATION  2017   TONSILLECTOMY AND ADENOIDECTOMY  1953    Family History  Problem Relation Age of Onset   Hypertension Mother    Cancer Father        MESOTHELIOMA   Cancer Sister        LUNG   Cancer Brother        LUNG    Social History   Socioeconomic History   Marital status: Married    Spouse name: [name]   Number of children: 3   Years of education: 13   Highest education level: Not on file  Occupational History   Occupation: RETIRED    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  Tobacco Use   Smoking status: Every Day    Packs/day: 1.00    Years: 50.00    Pack years: 50.00    Types: Cigarettes    Last attempt to quit: 08/26/2017    Years since quitting: 4.0   Smokeless tobacco: Never  Vaping Use   Vaping Use: Never used  Substance and Sexual Activity   Alcohol use: No   Drug use: No   Sexual activity:  Not Currently  Other Topics Concern   Not on file  Social History Narrative   Moved from [place]   Retired Games developer   Lives with [place] Determinants of Health   Financial Resource Strain: Low Risk    Difficulty of Paying Living Expenses: Not hard at all  Food Insecurity: No Food Insecurity   Worried About Charity fundraiser in the Last Year: Never true   Arboriculturist in the Last Year: Never true  Transportation Needs: No Transportation Needs   Lack of Transportation (Medical): No   Lack of Transportation (Non-Medical): No  Physical Activity: Insufficiently Active   Days of Exercise per Week: 5 days   Minutes of Exercise per Session: 20 min  Stress: No Stress Concern Present   Feeling of Stress : Not at all  Social Connections: Moderately Isolated   Frequency of Communication with Friends and Family: More than three times a week   Frequency of Social Gatherings with Friends and Family: More than three times a week   Attends Religious Services: Never   Marine scientist or Organizations: No   Attends Club or  Organization Meetings: Never   Marital Status: Married  Human resources officer Violence: Not At Risk   Fear of Current or Ex-Partner: No   Emotionally Abused: No   Physically Abused: No   Sexually Abused: No    Outpatient Medications Prior to Visit  Medication Sig Dispense Refill   ANORO ELLIPTA 62.5-25 MCG/INH AEPB USE 1 INHALATION BY MOUTH  INTO THE LUNGS DAILY 180 each 3   atorvastatin (LIPITOR) 40 MG tablet Take 1 tablet (40 mg total) by mouth daily. 90 tablet 3   Cholecalciferol (VITAMIN D-3) 125 MCG (5000 UT) TABS Take 1 tablet by mouth daily.     sennosides-docusate sodium (SENOKOT-S) 8.6-50 MG tablet Take 1-2 tablets by mouth daily as needed for constipation.     albuterol (VENTOLIN HFA) 108 (90 Base) MCG/ACT inhaler Inhale 2 puffs into the lungs every 4 (four) hours as needed for wheezing or shortness of breath. 1 each 0   azithromycin (ZITHROMAX)  250 MG tablet Take 2 tabs today, then take 1 tab daily until gone. 6 tablet 0   benzonatate (TESSALON) 100 MG capsule Take 1 capsule (100 mg total) by mouth 2 (two) times daily as needed for cough. 20 capsule 0   No facility-administered medications prior to visit.    Allergies  Allergen Reactions   Iodine Hives    ROS Review of Systems  Constitutional:  Negative for chills and fever.  HENT:  Negative for congestion and sore throat.   Eyes:  Negative for pain and discharge.  Respiratory:  Positive for shortness of breath. Negative for cough.   Cardiovascular:  Negative for chest pain and palpitations.  Gastrointestinal:  Positive for constipation. Negative for diarrhea, nausea and vomiting.  Endocrine: Negative for polydipsia and polyuria.  Genitourinary:  Negative for dysuria and hematuria.  Musculoskeletal:  Negative for neck pain and neck stiffness.  Skin:  Negative for rash.  Neurological:  Negative for dizziness, weakness, numbness and headaches.  Psychiatric/Behavioral:  Negative for agitation and behavioral problems.      Objective:    Physical Exam Vitals reviewed.  Constitutional:      General: He is not in acute distress.    Appearance: He is not diaphoretic.  HENT:     Head: Normocephalic and atraumatic.     Nose: Nose normal.     Mouth/Throat:     Mouth: Mucous membranes are moist.  Eyes:     General: No scleral icterus.    Extraocular Movements: Extraocular movements intact.  Cardiovascular:     Rate and Rhythm: Normal rate and regular rhythm.     Pulses: Normal pulses.     Heart sounds: Normal heart sounds. No murmur heard. Pulmonary:     Breath sounds: Normal breath sounds. No wheezing or rales.  Musculoskeletal:     Cervical back: Neck supple. No tenderness.     Right lower leg: No edema.     Left lower leg: No edema.  Skin:    General: Skin is warm.     Findings: No rash.  Neurological:     General: No focal deficit present.     Mental Status:  He is alert and oriented to person, place, and time.     Sensory: No sensory deficit.     Motor: No weakness.  Psychiatric:        Mood and Affect: Mood normal.        Behavior: Behavior normal.    BP (!) 142/64 (BP Location: Left Arm, Patient Position: Sitting, Cuff  Size: Normal)   Pulse 85   Resp 18   Ht 5' 9"  (1.753 m)   Wt 178 lb 1.9 oz (80.8 kg)   SpO2 94%   BMI 26.30 kg/m  Wt Readings from Last 3 Encounters:  09/04/21 178 lb 1.9 oz (80.8 kg)  07/04/21 173 lb (78.5 kg)  03/29/21 173 lb 12.8 oz (78.8 kg)    Lab Results  Component Value Date   TSH 0.87 02/22/2020   Lab Results  Component Value Date   WBC 6.5 09/01/2021   HGB 16.4 09/01/2021   HCT 48.8 09/01/2021   MCV 89 09/01/2021   PLT 256 09/01/2021   Lab Results  Component Value Date   NA 141 09/01/2021   K 4.6 09/01/2021   CO2 25 09/01/2021   GLUCOSE 80 09/01/2021   BUN 17 09/01/2021   CREATININE 0.96 09/01/2021   BILITOT 1.0 09/01/2021   ALKPHOS 84 09/01/2021   AST 15 09/01/2021   ALT 14 09/01/2021   PROT 6.6 09/01/2021   ALBUMIN 4.1 09/01/2021   CALCIUM 9.3 09/01/2021   EGFR 81 09/01/2021   Lab Results  Component Value Date   CHOL 150 09/01/2021   Lab Results  Component Value Date   HDL 43 09/01/2021   Lab Results  Component Value Date   LDLCALC 88 09/01/2021   Lab Results  Component Value Date   TRIG 103 09/01/2021   Lab Results  Component Value Date   CHOLHDL 2.8 03/29/2021   Lab Results  Component Value Date   HGBA1C 5.3 02/22/2020      Assessment & Plan:   Problem List Items Addressed This Visit       Cardiovascular and Mediastinum   Essential hypertension    BP Readings from Last 1 Encounters:  09/04/21 (!) 142/64  Elevated today Chart review suggests elevated BP during previous office visits Started losartan 25 mg daily Counseled for compliance with the medications Advised DASH diet and moderate exercise/walking, at least 150 mins/week       Relevant  Medications   losartan (COZAAR) 25 MG tablet   Other Relevant Orders   Basic Metabolic Panel (BMET)     Respiratory   COPD GOLD III  - Primary    Well-controlled with Anoro Had not been using albuterol, refilled Explained use of maintenance inhaler regularly and rescue inhaler when needed only      Relevant Medications   albuterol (VENTOLIN HFA) 108 (90 Base) MCG/ACT inhaler     Other   Constipation    Senokot as needed       Meds ordered this encounter  Medications   albuterol (VENTOLIN HFA) 108 (90 Base) MCG/ACT inhaler    Sig: Inhale 2 puffs into the lungs every 6 (six) hours as needed for wheezing or shortness of breath.    Dispense:  18 g    Refill:  5   losartan (COZAAR) 25 MG tablet    Sig: Take 1 tablet (25 mg total) by mouth daily.    Dispense:  90 tablet    Refill:  0    Follow-up: Return in about 3 months (around 12/05/2021) for HTN.    Lindell Spar, MD

## 2021-09-20 NOTE — Progress Notes (Signed)
Labs look good. You see him in February.

## 2021-10-13 LAB — BASIC METABOLIC PANEL
BUN/Creatinine Ratio: 19 (ref 10–24)
BUN: 16 mg/dL (ref 8–27)
CO2: 24 mmol/L (ref 20–29)
Calcium: 9.2 mg/dL (ref 8.6–10.2)
Chloride: 103 mmol/L (ref 96–106)
Creatinine, Ser: 0.85 mg/dL (ref 0.76–1.27)
Glucose: 80 mg/dL (ref 70–99)
Potassium: 4.4 mmol/L (ref 3.5–5.2)
Sodium: 141 mmol/L (ref 134–144)
eGFR: 89 mL/min/{1.73_m2} (ref >59)

## 2021-11-06 ENCOUNTER — Other Ambulatory Visit: Payer: Self-pay | Admitting: Internal Medicine

## 2021-11-06 DIAGNOSIS — I10 Essential (primary) hypertension: Secondary | ICD-10-CM

## 2021-11-13 DIAGNOSIS — L570 Actinic keratosis: Secondary | ICD-10-CM | POA: Diagnosis not present

## 2021-11-13 DIAGNOSIS — Z85828 Personal history of other malignant neoplasm of skin: Secondary | ICD-10-CM | POA: Diagnosis not present

## 2021-11-20 ENCOUNTER — Other Ambulatory Visit: Payer: Self-pay | Admitting: *Deleted

## 2021-11-20 DIAGNOSIS — H25813 Combined forms of age-related cataract, bilateral: Secondary | ICD-10-CM | POA: Diagnosis not present

## 2021-11-20 DIAGNOSIS — H52203 Unspecified astigmatism, bilateral: Secondary | ICD-10-CM | POA: Diagnosis not present

## 2021-11-20 DIAGNOSIS — H5203 Hypermetropia, bilateral: Secondary | ICD-10-CM | POA: Diagnosis not present

## 2021-11-20 MED ORDER — UMECLIDINIUM-VILANTEROL 62.5-25 MCG/ACT IN AEPB: 1.0000 | INHALATION_SPRAY | Freq: Every day | RESPIRATORY_TRACT | 1 refills | Status: DC

## 2021-12-05 ENCOUNTER — Other Ambulatory Visit: Payer: Self-pay

## 2021-12-05 ENCOUNTER — Ambulatory Visit (INDEPENDENT_AMBULATORY_CARE_PROVIDER_SITE_OTHER): Payer: Medicare Other | Admitting: Internal Medicine

## 2021-12-05 ENCOUNTER — Encounter: Payer: Self-pay | Admitting: Internal Medicine

## 2021-12-05 VITALS — BP 126/78 | HR 75 | Resp 18 | Ht 69.0 in | Wt 179.8 lb

## 2021-12-05 DIAGNOSIS — J449 Chronic obstructive pulmonary disease, unspecified: Secondary | ICD-10-CM | POA: Diagnosis not present

## 2021-12-05 DIAGNOSIS — Z720 Tobacco use: Secondary | ICD-10-CM

## 2021-12-05 DIAGNOSIS — F17210 Nicotine dependence, cigarettes, uncomplicated: Secondary | ICD-10-CM | POA: Diagnosis not present

## 2021-12-05 DIAGNOSIS — I10 Essential (primary) hypertension: Secondary | ICD-10-CM | POA: Diagnosis not present

## 2021-12-05 DIAGNOSIS — I714 Abdominal aortic aneurysm, without rupture, unspecified: Secondary | ICD-10-CM | POA: Diagnosis not present

## 2021-12-05 NOTE — Assessment & Plan Note (Signed)
BP Readings from Last 1 Encounters:  12/05/21 126/78   Well-controlled with losartan 25 mg daily now Counseled for compliance with the medications Advised DASH diet and moderate exercise/walking, at least 150 mins/week

## 2021-12-05 NOTE — Assessment & Plan Note (Signed)
Smokes about 0.25 pack/day  Asked about quitting: confirms that he/she currently smokes cigarettes Advise to quit smoking: Educated about QUITTING to reduce the risk of cancer, cardio and cerebrovascular disease. Assess willingness: Unwilling to quit at this time, but is working on cutting back. Assist with counseling and pharmacotherapy: Counseled for 5 minutes and literature provided. Arrange for follow up: follow up in 3 months and continue to offer help. 

## 2021-12-05 NOTE — Progress Notes (Signed)
Established Patient Office Visit  Subjective:  Patient ID: David Rodriguez, male    DOB: 1946/08/07  Age: 76 y.o. MRN: [id number]  CC:  Chief Complaint  Patient presents with   Follow-up    3 month follow up HTN     HPI David Rodriguez is a 76 y.o. male with past medical history of CAD, HTN, COPD and tobacco abuse who presents for f/u of his chronic medical conditions.  HTN: BP is well-controlled. Takes medications regularly. Patient denies headache, dizziness, chest pain, dyspnea or palpitations.  COPD: Well-controlled with Anoro and PRN Albuterol. Still smokes about 3-5 cigarettes per day.  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   Hyperlipidemia    Tobacco abuse 10/30/2016    Past Surgical History:  Procedure Laterality Date   ABDOMINAL AORTIC ANEURYSM REPAIR  2013   CARDIAC CATHETERIZATION  2017   TONSILLECTOMY AND ADENOIDECTOMY  1953    Family History  Problem Relation Age of Onset   Hypertension Mother    Cancer Father        MESOTHELIOMA   Cancer Sister        LUNG   Cancer Brother        LUNG    Social History   Socioeconomic History   Marital status: Married    Spouse name: David Rodriguez   Number of children: 3   Years of education: 13   Highest education level: Not on file  Occupational History   Occupation: RETIRED    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  Tobacco Use   Smoking status: Every Day    Packs/day: 1.00    Years: 50.00    Pack years: 50.00    Types: Cigarettes    Last attempt to quit: 08/26/2017    Years since quitting: 4.2   Smokeless tobacco: Never  Vaping Use   Vaping Use: Never used  Substance and Sexual Activity   Alcohol use: No   Drug use: No   Sexual activity: Not Currently  Other Topics Concern   Not on file  Social History Narrative   Moved from [place]   Retired Games developer   Lives with [place] Determinants of Health   Financial Resource Strain: Low Risk    Difficulty of Paying Living  Expenses: Not hard at all  Food Insecurity: No Food Insecurity   Worried About Charity fundraiser in the Last Year: Never true   Arboriculturist in the Last Year: Never true  Transportation Needs: No Transportation Needs   Lack of Transportation (Medical): No   Lack of Transportation (Non-Medical): No  Physical Activity: Insufficiently Active   Days of Exercise per Week: 5 days   Minutes of Exercise per Session: 20 min  Stress: No Stress Concern Present   Feeling of Stress : Not at all  Social Connections: Moderately Isolated   Frequency of Communication with Friends and Family: More than three times a week   Frequency of Social Gatherings with Friends and Family: More than three times a week   Attends Religious Services: Never   Marine scientist or Organizations: No   Attends Music therapist: Never   Marital Status: Married  Human resources officer Violence: Not At Risk   Fear of Current or Ex-Partner: No   Emotionally Abused: No   Physically Abused: No   Sexually Abused: No    Outpatient Medications Prior to Visit  Medication Sig Dispense Refill  albuterol (VENTOLIN HFA) 108 (90 Base) MCG/ACT inhaler Inhale 2 puffs into the lungs every 6 (six) hours as needed for wheezing or shortness of breath. 18 g 5   atorvastatin (LIPITOR) 40 MG tablet Take 1 tablet (40 mg total) by mouth daily. 90 tablet 3   Cholecalciferol (VITAMIN D-3) 125 MCG (5000 UT) TABS Take 1 tablet by mouth daily.     losartan (COZAAR) 25 MG tablet TAKE 1 TABLET BY MOUTH DAILY 90 tablet 3   sennosides-docusate sodium (SENOKOT-S) 8.6-50 MG tablet Take 1-2 tablets by mouth daily as needed for constipation.     umeclidinium-vilanterol (ANORO ELLIPTA) 62.5-25 MCG/ACT AEPB Inhale 1 puff into the lungs daily at 6 (six) AM. 1 each 1   ANORO ELLIPTA 62.5-25 MCG/INH AEPB USE 1 INHALATION BY MOUTH  INTO THE LUNGS DAILY 180 each 3   No facility-administered medications prior to visit.    Allergies  Allergen  Reactions   Iodine Hives    ROS Review of Systems  Constitutional:  Negative for chills and fever.  HENT:  Negative for congestion and sore throat.   Eyes:  Negative for pain and discharge.  Respiratory:  Negative for cough and shortness of breath.   Cardiovascular:  Negative for chest pain and palpitations.  Gastrointestinal:  Positive for constipation. Negative for diarrhea, nausea and vomiting.  Endocrine: Negative for polydipsia and polyuria.  Genitourinary:  Negative for dysuria and hematuria.  Musculoskeletal:  Negative for neck pain and neck stiffness.  Skin:  Negative for rash.  Neurological:  Negative for dizziness, weakness, numbness and headaches.  Psychiatric/Behavioral:  Negative for agitation and behavioral problems.      Objective:    Physical Exam Vitals reviewed.  Constitutional:      General: He is not in acute distress.    Appearance: He is not diaphoretic.  HENT:     Head: Normocephalic and atraumatic.     Nose: Nose normal.     Mouth/Throat:     Mouth: Mucous membranes are moist.  Eyes:     General: No scleral icterus.    Extraocular Movements: Extraocular movements intact.  Cardiovascular:     Rate and Rhythm: Normal rate and regular rhythm.     Pulses: Normal pulses.     Heart sounds: Normal heart sounds. No murmur heard. Pulmonary:     Breath sounds: Normal breath sounds. No wheezing or rales.  Musculoskeletal:     Cervical back: Neck supple. No tenderness.     Right lower leg: No edema.     Left lower leg: No edema.  Skin:    General: Skin is warm.     Findings: No rash.  Neurological:     General: No focal deficit present.     Mental Status: He is alert and oriented to person, place, and time.     Sensory: No sensory deficit.     Motor: No weakness.  Psychiatric:        Mood and Affect: Mood normal.        Behavior: Behavior normal.    BP 126/78 (BP Location: Left Arm, Cuff Size: Normal)    Pulse 75    Resp 18    Ht 5' 9"  (1.753 m)     Wt 179 lb 12.8 oz (81.6 kg)    SpO2 92%    BMI 26.55 kg/m  Wt Readings from Last 3 Encounters:  12/05/21 179 lb 12.8 oz (81.6 kg)  09/04/21 178 lb 1.9 oz (80.8 kg)  07/04/21 173  lb (78.5 kg)    Lab Results  Component Value Date   TSH 0.87 02/22/2020   Lab Results  Component Value Date   WBC 6.5 09/01/2021   HGB 16.4 09/01/2021   HCT 48.8 09/01/2021   MCV 89 09/01/2021   PLT 256 09/01/2021   Lab Results  Component Value Date   NA 141 10/12/2021   K 4.4 10/12/2021   CO2 24 10/12/2021   GLUCOSE 80 10/12/2021   BUN 16 10/12/2021   CREATININE 0.85 10/12/2021   BILITOT 1.0 09/01/2021   ALKPHOS 84 09/01/2021   AST 15 09/01/2021   ALT 14 09/01/2021   PROT 6.6 09/01/2021   ALBUMIN 4.1 09/01/2021   CALCIUM 9.2 10/12/2021   EGFR 89 10/12/2021   Lab Results  Component Value Date   CHOL 150 09/01/2021   Lab Results  Component Value Date   HDL 43 09/01/2021   Lab Results  Component Value Date   LDLCALC 88 09/01/2021   Lab Results  Component Value Date   TRIG 103 09/01/2021   Lab Results  Component Value Date   CHOLHDL 2.8 03/29/2021   Lab Results  Component Value Date   HGBA1C 5.3 02/22/2020      Assessment & Plan:   Problem List Items Addressed This Visit       Cardiovascular and Mediastinum   AAA (abdominal aortic aneurysm) without rupture    History of domino aortic aneurysm repair in 2011 in [place]. Followed by Vascular surgery Needs to quit smoking      Essential hypertension - Primary    BP Readings from Last 1 Encounters:  12/05/21 126/78  Well-controlled with losartan 25 mg daily now Counseled for compliance with the medications Advised DASH diet and moderate exercise/walking, at least 150 mins/week         Respiratory   COPD GOLD III     Well-controlled with Anoro and PRN albuterol, refilled Explained use of maintenance inhaler regularly and rescue inhaler when needed only        Other   Tobacco abuse    Smokes  about 0.25 pack/day  Asked about quitting: confirms that he/she currently smokes cigarettes Advise to quit smoking: Educated about QUITTING to reduce the risk of cancer, cardio and cerebrovascular disease. Assess willingness: Unwilling to quit at this time, but is working on cutting back. Assist with counseling and pharmacotherapy: Counseled for 5 minutes and literature provided. Arrange for follow up: follow up in 3 months and continue to offer help.       No orders of the defined types were placed in this encounter.   Follow-up: Return in about 6 months (around 06/04/2022) for Annual physical.    Lindell Spar, MD

## 2021-12-05 NOTE — Patient Instructions (Addendum)
Please continue taking medications as prescribed.  Please continue to follow low salt diet and ambulate as tolerated. 

## 2021-12-05 NOTE — Assessment & Plan Note (Signed)
Well-controlled with Anoro and PRN albuterol, refilled Explained use of maintenance inhaler regularly and rescue inhaler when needed only

## 2021-12-05 NOTE — Assessment & Plan Note (Addendum)
History of domino aortic aneurysm repair in 2011 in [place]. Followed by Vascular surgery Needs to quit smoking 

## 2021-12-31 ENCOUNTER — Other Ambulatory Visit: Payer: Self-pay | Admitting: Internal Medicine

## 2022-01-05 ENCOUNTER — Telehealth: Payer: Self-pay

## 2022-01-05 NOTE — Telephone Encounter (Signed)
Patient came by the office dropped off covid vaccine cards to copy (in Dr Posey Pronto box). Patient asking does he need any other covid shots. Please contact patient at [phone]. ?

## 2022-01-05 NOTE — Telephone Encounter (Signed)
LVM to let pt know no more covid vaccines are needed at this time  ?

## 2022-02-05 ENCOUNTER — Encounter: Payer: Self-pay | Admitting: Internal Medicine

## 2022-02-14 ENCOUNTER — Other Ambulatory Visit: Payer: Self-pay | Admitting: *Deleted

## 2022-02-14 DIAGNOSIS — I724 Aneurysm of artery of lower extremity: Secondary | ICD-10-CM

## 2022-02-14 DIAGNOSIS — Z9889 Other specified postprocedural states: Secondary | ICD-10-CM

## 2022-02-23 ENCOUNTER — Encounter: Payer: Self-pay | Admitting: Vascular Surgery

## 2022-02-28 ENCOUNTER — Ambulatory Visit (INDEPENDENT_AMBULATORY_CARE_PROVIDER_SITE_OTHER)
Admission: RE | Admit: 2022-02-28 | Discharge: 2022-02-28 | Disposition: A | Discharge status: Home / Self Care | Payer: Medicare Other | Source: Ambulatory Visit | Attending: Vascular Surgery | Admitting: Vascular Surgery

## 2022-02-28 ENCOUNTER — Ambulatory Visit (HOSPITAL_COMMUNITY)
Admission: RE | Admit: 2022-02-28 | Discharge: 2022-02-28 | Disposition: A | Discharge status: Home / Self Care | Payer: Medicare Other | Source: Ambulatory Visit | Attending: Vascular Surgery | Admitting: Vascular Surgery

## 2022-02-28 ENCOUNTER — Ambulatory Visit: Payer: Medicare Other | Admitting: Physician Assistant

## 2022-02-28 VITALS — BP 142/79 | HR 63 | Temp 97.7°F | Resp 20 | Ht 69.0 in | Wt 178.1 lb

## 2022-02-28 DIAGNOSIS — Z9889 Other specified postprocedural states: Secondary | ICD-10-CM | POA: Diagnosis not present

## 2022-02-28 DIAGNOSIS — I724 Aneurysm of artery of lower extremity: Secondary | ICD-10-CM | POA: Diagnosis not present

## 2022-02-28 NOTE — Progress Notes (Addendum)
?Office Note  ? ? ? ?CC:  follow up ?Requesting Provider:  Lindell Spar, MD ? ?HPI: David Rodriguez is a 76 y.o. ([birth year]) male who presents for surveillance of endovascular aneurysm repair.  Surgery was performed in [facility] in 2011.  He denies any new or changing abdominal or back pain.  He is also followed for bilateral popliteal artery aneurysms which have been stable over the past several years.  He also denies any claudication, rest pain, or nonhealing wounds of bilateral lower extremities.  He is an everyday smoker.  He takes a statin daily. ? ? ?Past Medical History:  ?Diagnosis Date  ? Allergy   ? 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 Hyperlipidemia   ? Tobacco abuse 10/30/2016  ? ? ?Past Surgical History:  ?Procedure Laterality Date  ? ABDOMINAL AORTIC ANEURYSM REPAIR  2013  ? CARDIAC CATHETERIZATION  2017  ? TONSILLECTOMY AND ADENOIDECTOMY  1953  ? ? ?Social History  ? ?Socioeconomic History  ? Marital status: Married  ?  Spouse name: [name]  ? Number of children: 3  ? Years of education: 5  ? Highest education level: Not on file  ?Occupational History  ? 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 Smoking status: Every Day  ?  Packs/day: 1.00  ?  Years: 50.00  ?  Pack years: 50.00  ?  Types: Cigarettes  ?  Last attempt to quit: 08/26/2017  ?  Years since quitting: 4.5  ?  Passive exposure: Never  ? Smokeless tobacco: Never  ?Vaping Use  ? Vaping Use: Never used  ?Substance and Sexual Activity  ? Alcohol use: No  ? Drug use: No  ? Sexual activity: Not Currently  ?Other Topics Concern  ? Not on file  ?Social History Narrative  ? Moved from [place]  ? Retired Games developer  ? Lives with Danton Clap  ? ?Social Determinants of Health  ? ?Financial Resource Strain: Low Risk   ? Difficulty of Paying Living Expenses: Not hard at all  ?Food Insecurity: No Food Insecurity  ? Worried About Charity fundraiser in the Last Year: Never true  ? Ran Out of Food in the Last Year: Never true   ?Transportation Needs: No Transportation Needs  ? Lack of Transportation (Medical): No  ? Lack of Transportation (Non-Medical): No  ?Physical Activity: Insufficiently Active  ? Days of Exercise per Week: 5 days  ? Minutes of Exercise per Session: 20 min  ?Stress: No Stress Concern Present  ? Feeling of Stress : Not at all  ?Social Connections: Moderately Isolated  ? Frequency of Communication with Friends and Family: More than three times a week  ? Frequency of Social Gatherings with Friends and Family: More than three times a week  ? Attends Religious Services: Never  ? Active Member of Clubs or Organizations: No  ? Attends Archivist Meetings: Never  ? Marital Status: Married  ?Intimate Partner Violence: Not At Risk  ? Fear of Current or Ex-Partner: No  ? Emotionally Abused: No  ? Physically Abused: No  ? Sexually Abused: No  ? ? ?Family History  ?Problem Relation Age of Onset  ? Hypertension Mother   ? Cancer Father   ?     MESOTHELIOMA  ? Cancer Sister   ?     LUNG  ? Cancer Brother   ?     LUNG  ? ? ?Current Outpatient Medications  ?Medication Sig Dispense Refill  ?  albuterol (VENTOLIN HFA) 108 (90 Base) MCG/ACT inhaler Inhale 2 puffs into the lungs every 6 (six) hours as needed for wheezing or shortness of breath. 18 g 5  ? ANORO ELLIPTA 62.5-25 MCG/ACT AEPB USE 1 INHALATION BY MOUTH DAILY  AT 6AM 120 each 5  ? atorvastatin (LIPITOR) 40 MG tablet Take 1 tablet (40 mg total) by mouth daily. 90 tablet 3  ? Cholecalciferol (VITAMIN D-3) 125 MCG (5000 UT) TABS Take 1 tablet by mouth daily.    ? losartan (COZAAR) 25 MG tablet TAKE 1 TABLET BY MOUTH DAILY 90 tablet 3  ? sennosides-docusate sodium (SENOKOT-S) 8.6-50 MG tablet Take 1-2 tablets by mouth daily as needed for constipation.    ? ?No current facility-administered medications for this visit.  ? ? ?Allergies  ?Allergen Reactions  ? Iodine Hives  ? ? ? ?REVIEW OF SYSTEMS:  ? ?'[X]'$  denotes positive finding, '[ ]'$  denotes negative finding ?Cardiac   Comments:  ?Chest pain or chest pressure:    ?Shortness of breath upon exertion:    ?Short of breath when lying flat:    ?Irregular heart rhythm:    ?    ?Vascular    ?Pain in calf, thigh, or hip brought on by ambulation:    ?Pain in feet at night that wakes you up from your sleep:     ?Blood clot in your veins:    ?Leg swelling:     ?    ?Pulmonary    ?Oxygen at home:    ?Productive cough:     ?Wheezing:     ?    ?Neurologic    ?Sudden weakness in arms or legs:     ?Sudden numbness in arms or legs:     ?Sudden onset of difficulty speaking or slurred speech:    ?Temporary loss of vision in one eye:     ?Problems with dizziness:     ?    ?Gastrointestinal    ?Blood in stool:     ?Vomited blood:     ?    ?Genitourinary    ?Burning when urinating:     ?Blood in urine:    ?    ?Psychiatric    ?Major depression:     ?    ?Hematologic    ?Bleeding problems:    ?Problems with blood clotting too easily:    ?    ?Skin    ?Rashes or ulcers:    ?    ?Constitutional    ?Fever or chills:    ? ? ?PHYSICAL EXAMINATION: ? ?Vitals:  ? 02/28/22 1043  ?BP: (!) 142/79  ?Pulse: 63  ?Resp: 20  ?Temp: 97.7 ?F (36.5 ?C)  ?TempSrc: Temporal  ?SpO2: 95%  ?Weight: 178 lb 1.6 oz (80.8 kg)  ?Height: '5\' 9"'$  (1.753 m)  ? ? ?General:  WDWN in NAD; vital signs documented above ?Gait: Not observed ?HENT: WNL, normocephalic ?Pulmonary: normal non-labored breathing , without Rales, rhonchi,  wheezing ?Cardiac: regular HR ?Abdomen: soft, NT, no masses ?Skin: without rashes ?Vascular Exam/Pulses: ? Right Left  ?Radial 2+ (normal) 2+ (normal)  ?DP 2+ (normal) 2+ (normal)  ? ?Extremities: without ischemic changes, without Gangrene , without cellulitis; without open wounds;  ?Musculoskeletal: no muscle wasting or atrophy  ?Neurologic: A&O X 3;  No focal weakness or paresthesias are detected ?Psychiatric:  The pt has Normal affect. ? ? 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 ?ABI/TBIToday's ABIToday's TBIPrevious ABIPrevious TBI  ?+-------+-----------+-----------+------------+------------+  ?Right  1.10       0.71                                 ?+-------+-----------+-----------+------------+------------+  ?Left   1.10       0.76         ? ? ? ?ASSESSMENT/PLAN:: 76 y.o. male here for follow up for surveillance of EVAR and popliteal aneurysms ? ?-EVAR duplex demonstrates a widely patent EVAR with no endoleaks.  Repeat EVAR duplex in 1 year ?-Popliteal artery aneurysms bilaterally are stable.  The right side measures 1.62 in largest diameter in the left measures 1.3 cm at the largest diameter.  No indication for repair at this time.  Recheck arterial duplex in 1 year ?-ABIs also stable from year to year.  Patient denies any claudication symptoms. 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  Patient was encouraged to follow-up with his PCP for further management if any ?-Patient will call/return office sooner with any questions or concerns ? ? ?Dagoberto Ligas, PA-C ?[facility] ?[phone] ? ?Clinic MD:   Donzetta Matters ? ?

## 2022-04-22 ENCOUNTER — Other Ambulatory Visit (INDEPENDENT_AMBULATORY_CARE_PROVIDER_SITE_OTHER): Payer: Self-pay | Admitting: Nurse Practitioner

## 2022-04-22 DIAGNOSIS — E785 Hyperlipidemia, unspecified: Secondary | ICD-10-CM

## 2022-04-25 ENCOUNTER — Telehealth: Payer: Self-pay | Admitting: Internal Medicine

## 2022-04-25 ENCOUNTER — Other Ambulatory Visit: Payer: Self-pay | Admitting: *Deleted

## 2022-04-25 DIAGNOSIS — E785 Hyperlipidemia, unspecified: Secondary | ICD-10-CM

## 2022-04-25 MED ORDER — ATORVASTATIN CALCIUM 40 MG PO TABS: 40.0000 mg | ORAL_TABLET | Freq: Every day | ORAL | 3 refills | Status: DC

## 2022-04-25 NOTE — Telephone Encounter (Signed)
Medication refill awv scheduled

## 2022-04-25 NOTE — Telephone Encounter (Signed)
Patient needs refill on   atorvastatin (LIPITOR) 40 MG tablet

## 2022-04-27 ENCOUNTER — Ambulatory Visit (INDEPENDENT_AMBULATORY_CARE_PROVIDER_SITE_OTHER): Payer: Medicare Other

## 2022-04-27 DIAGNOSIS — Z0000 Encounter for general adult medical examination without abnormal findings: Secondary | ICD-10-CM

## 2022-04-27 NOTE — Patient Instructions (Addendum)
  David Rodriguez , Thank you for taking time to come for your Medicare Wellness Visit. I appreciate your ongoing commitment to your health goals. Please review the following plan we discussed and let me know if I can assist you in the future.   These are the goals we discussed:  Goals      Prevent falls     STOP SMOKING     Patient states has cut back on smoking. Currently smokes 4-5 packs per week        This is a list of the screening recommended for you and due dates:  Health Maintenance  Topic Date Due   COVID-19 Vaccine (6 - Pfizer series) 08/22/2021   Flu Shot  05/15/2022   Tetanus Vaccine  10/15/2024   Pneumonia Vaccine  Completed   Hepatitis C Screening: USPSTF Recommendation to screen - Ages 18-79 yo.  Completed   Zoster (Shingles) Vaccine  Completed   HPV Vaccine  Aged Out   Colon Cancer Screening  Discontinued

## 2022-04-27 NOTE — Progress Notes (Signed)
I connected with  David Rodriguez on 04/27/22 by a audio enabled telemedicine application and verified that I am speaking with the correct person using two identifiers.  Patient Location: Home  Provider Location: Office/Clinic  I discussed the limitations of evaluation and management by telemedicine. The patient expressed understanding and agreed to proceed.  Subjective:   David Rodriguez is a 76 y.o. male who presents for Medicare Annual/Subsequent preventive examination.  Review of Systems     Cardiac Risk Factors include: dyslipidemia;hypertension;smoking/ tobacco exposure;male gender     Objective:    There were no vitals filed for this visit. There is no height or weight on file to calculate BMI.     04/27/2022    1:37 PM 03/27/2021    8:22 AM  Advanced Directives  Does Patient Have a Medical Advance Directive? Yes Yes  Type of Corporate treasurer of [name];Living will  Does patient want to make changes to medical advance directive? No - Patient declined   Copy of [place] in Chart?  No - copy requested    Current Medications (verified) Outpatient Encounter Medications as of 04/27/2022  Medication Sig   albuterol (VENTOLIN HFA) 108 (90 Base) MCG/ACT inhaler Inhale 2 puffs into the lungs every 6 (six) hours as needed for wheezing or shortness of breath.   ANORO ELLIPTA 62.5-25 MCG/ACT AEPB USE 1 INHALATION BY MOUTH DAILY  AT 6AM   atorvastatin (LIPITOR) 40 MG tablet Take 1 tablet (40 mg total) by mouth daily.   Cholecalciferol (VITAMIN D-3) 125 MCG (5000 UT) TABS Take 1 tablet by mouth daily.   losartan (COZAAR) 25 MG tablet TAKE 1 TABLET BY MOUTH DAILY   sennosides-docusate sodium (SENOKOT-S) 8.6-50 MG tablet Take 1-2 tablets by mouth daily as needed for constipation.   No facility-administered encounter medications on file as of 04/27/2022.    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   Hyperlipidemia    Tobacco abuse 10/30/2016   Past Surgical History:  Procedure Laterality Date   ABDOMINAL AORTIC ANEURYSM REPAIR  2013   CARDIAC CATHETERIZATION  2017   TONSILLECTOMY AND ADENOIDECTOMY  1953   Family History  Problem Relation Age of Onset   Hypertension Mother    Cancer Father        MESOTHELIOMA   Cancer Sister        LUNG   Cancer Brother        LUNG   Social History   Socioeconomic History   Marital status: Married    Spouse name: David Rodriguez   Number of children: 3   Years of education: 13   Highest education level: Not on file  Occupational History   Occupation: RETIRED    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  Tobacco Use   Smoking status: Every Day    Packs/day: 1.00    Years: 50.00    Total pack years: 50.00    Types: Cigarettes    Last attempt to quit: 08/26/2017    Years since quitting: 4.6    Passive exposure: Never   Smokeless tobacco: Never  Vaping Use   Vaping Use: Never used  Substance and Sexual Activity   Alcohol use: No   Drug use: No   Sexual activity: Not Currently  Other Topics Concern   Not on file  Social History Narrative   Moved from [place]   Retired Games developer   Lives with [name]  Social Determinants of Health   Financial Resource Strain: Low Risk  (04/26/2022)   Overall Financial Resource Strain (CARDIA)    Difficulty of Paying Living Expenses: Not hard at all  Food Insecurity: No Food Insecurity (04/26/2022)   Hunger Vital Sign    Worried About Running Out of Food in the Last Year: Never true    Ran Out of Food in the Last Year: Never true  Transportation Needs: No Transportation Needs (04/26/2022)   PRAPARE - Hydrologist (Medical): No    Lack of Transportation (Non-Medical): No  Physical Activity: Insufficiently Active (04/26/2022)   Exercise Vital Sign    Days of Exercise per Week: 4 days    Minutes of Exercise per Session: 30 min  Stress: No Stress Concern Present  (04/26/2022)   [place]    Feeling of Stress : Not at all  Social Connections: Unknown (04/26/2022)   Social Connection and Isolation Panel [NHANES]    Frequency of Communication with Friends and Family: Once a week    Frequency of Social Gatherings with Friends and Family: Once a week    Attends Religious Services: Not on Advertising copywriter or Organizations: No    Attends Music therapist: Not on file    Marital Status: Married    Tobacco Counseling Ready to quit: Not Answered Counseling given: Not Answered   Clinical Intake:           Diabetes: No  How often do you need to have someone help you when you read instructions, pamphlets, or other written materials from your doctor or pharmacy?: 1 - Never  Diabetic?no  Interpreter Needed?: No      Activities of Daily Living    04/26/2022    9:24 PM  In your present state of health, do you have any difficulty performing the following activities:  Hearing? 0  Vision? 0  Difficulty concentrating or making decisions? 0  Walking or climbing stairs? 0  Dressing or bathing? 0  Doing errands, shopping? 0  Preparing Food and eating ? N  Using the Toilet? N  In the past six months, have you accidently leaked urine? N  Do you have problems with loss of bowel control? N  Managing your Medications? N  Managing your Finances? N  Housekeeping or managing your Housekeeping? N    Patient Care Team: Lindell Spar, MD as PCP - General (Internal Medicine) Herminio Commons, MD (Inactive) as PCP - Cardiology (Cardiology)  Indicate any recent Medical Services you may have received from other than [facility] providers in the past year (date may be approximate).     Assessment:   This is a routine wellness examination for David Rodriguez.  Hearing/Vision screen No results found.  Dietary issues and exercise activities discussed:     Goals  Addressed             This Visit's Progress    Prevent falls         Depression Screen    12/05/2021    9:02 AM 09/04/2021    1:01 PM 07/04/2021    9:55 AM 03/27/2021    8:25 AM 02/01/2021    2:27 PM 09/05/2020    7:50 AM 02/22/2020    9:14 AM  PHQ 2/9 Scores  PHQ - 2 Score 0 0 0 0 0 0 0  PHQ- 9 Score    0 0 0  Fall Risk    04/26/2022    9:24 PM 12/05/2021    9:02 AM 09/04/2021    1:01 PM 07/04/2021    9:54 AM 03/27/2021    8:24 AM  Fall Risk   Falls in the past year? 0 0 0 0 0  Number falls in past yr:  0 0 0 0  Injury with Fall?  0 0 0 0  Risk for fall due to :  No Fall Risks No Fall Risks No Fall Risks No Fall Risks  Follow up  Falls evaluation completed Falls evaluation completed Falls evaluation completed Falls evaluation completed;Falls prevention discussed    FALL RISK PREVENTION PERTAINING TO THE HOME:  Any stairs in or around the home? No  If so, are there any without handrails? No  Home free of loose throw rugs in walkways, pet beds, electrical cords, etc? Yes  Adequate lighting in your home to reduce risk of falls? Yes   ASSISTIVE DEVICES UTILIZED TO PREVENT FALLS:  Life alert? No  Use of a cane, walker or w/c? No  Grab bars in the bathroom? Yes  Shower chair or bench in shower? Yes  Elevated toilet seat or a handicapped toilet? No    Cognitive Function:        04/27/2022    2:53 PM  6CIT Screen  What Year? 0 points  What month? 0 points  What time? 0 points  Count back from 20 0 points  Months in reverse 0 points  Repeat phrase 0 points  Total Score 0 points    Immunizations Immunization History  Administered Date(s) Administered   Fluad Quad(high Dose 65+) 07/29/2019, 07/20/2020, 07/04/2021   Influenza-Unspecified 07/14/2018, 07/29/2019, 07/04/2021   Moderna Sars-Covid-2 Vaccination 01/13/2021   PFIZER(Purple Top)SARS-COV-2 Vaccination 11/19/2019, 12/15/2019, 07/05/2020, 06/27/2021   Pneumococcal Conjugate-13 08/03/2015    Pneumococcal Polysaccharide-23 02/22/2020   Td 10/15/2014   Zoster Recombinat (Shingrix) 02/05/2022, 04/10/2022    TDAP status: Up to date  Flu Vaccine status: Up to date  Pneumococcal vaccine status: Up to date  Covid-19 vaccine status: Completed vaccines  Qualifies for Shingles Vaccine? Yes   Zostavax completed No   Shingrix- had both at [facility] Screening Tests Health Maintenance  Topic Date Due   COVID-19 Vaccine (6 - Pfizer series) 08/22/2021   INFLUENZA VACCINE  05/15/2022   TETANUS/TDAP  10/15/2024   Pneumonia Vaccine 55+ Years old  Completed   Hepatitis C Screening  Completed   Zoster Vaccines- Shingrix  Completed   HPV VACCINES  Aged Out   COLONOSCOPY (Pts 45-74yr Insurance coverage will need to be confirmed)  DPoplar-Cotton CenterMaintenance Due  Topic Date Due   COVID-19 Vaccine (6 - [place]lev[place]eries) 08/22/2021    Colorectal cancer screening: No longer required.   Lung Cancer Screening: (Low Dose CT Chest recommended if Age 78-80years, 30 pack-year currently smoking OR have quit w/in 15years.) does qualify.   Lung Cancer Screening Referral: will refer if patient agrees   Additional Screening:  Hepatitis C Screening: does qualify; Completed   Vision Screening: Recommended annual ophthalmology exams for early detection of glaucoma and other disorders of the eye. Is the patient up to date with their annual eye exam?  Yes  Who is the provider or what is the name of the office in which the patient attends annual eye exams?dr [facility] [place] If pt is not established with a provider, would they like to be referred to a provider to establish care?  No .   Dental Screening: Recommended annual dental exams for proper oral hygiene  Community Resource Referral / Chronic Care Management: CRR required this visit?  No   CCM required this visit?  No      Plan:     I have personally reviewed and noted the following in the patient's chart:    Medical and social history Use of alcohol, tobacco or illicit drugs  Current medications and supplements including opioid prescriptions. Patient is not currently taking opioid prescriptions. Functional ability and status Nutritional status Physical activity Advanced directives List of other physicians Hospitalizations, surgeries, and ER visits in previous 12 months Vitals Screenings to include cognitive, depression, and falls Referrals and appointments  In addition, I have reviewed and discussed with patient certain preventive protocols, quality metrics, and best practice recommendations. A written personalized care plan for preventive services as well as general preventive health recommendations were provided to patient.     Eual Fines, LPN   2/94/7654   Nurse Notes: Schedule your next wellness visit for 1 year at checkout

## 2022-06-06 ENCOUNTER — Encounter: Payer: Self-pay | Admitting: Internal Medicine

## 2022-06-06 ENCOUNTER — Ambulatory Visit (INDEPENDENT_AMBULATORY_CARE_PROVIDER_SITE_OTHER): Payer: Medicare Other | Admitting: Internal Medicine

## 2022-06-06 VITALS — BP 124/66 | HR 72 | Ht 69.0 in | Wt 178.2 lb

## 2022-06-06 DIAGNOSIS — I10 Essential (primary) hypertension: Secondary | ICD-10-CM | POA: Diagnosis not present

## 2022-06-06 DIAGNOSIS — Z0001 Encounter for general adult medical examination with abnormal findings: Secondary | ICD-10-CM

## 2022-06-06 DIAGNOSIS — I724 Aneurysm of artery of lower extremity: Secondary | ICD-10-CM | POA: Diagnosis not present

## 2022-06-06 DIAGNOSIS — E785 Hyperlipidemia, unspecified: Secondary | ICD-10-CM

## 2022-06-06 DIAGNOSIS — R7303 Prediabetes: Secondary | ICD-10-CM

## 2022-06-06 DIAGNOSIS — E559 Vitamin D deficiency, unspecified: Secondary | ICD-10-CM

## 2022-06-06 DIAGNOSIS — K7689 Other specified diseases of liver: Secondary | ICD-10-CM | POA: Insufficient documentation

## 2022-06-06 DIAGNOSIS — J449 Chronic obstructive pulmonary disease, unspecified: Secondary | ICD-10-CM | POA: Diagnosis not present

## 2022-06-06 NOTE — Assessment & Plan Note (Signed)
On statin.

## 2022-06-06 NOTE — Assessment & Plan Note (Signed)
Physical exam as documented. Fasting blood tests ordered. Up-to-date with pneumococcal and Shingrix vaccine.

## 2022-06-06 NOTE — Patient Instructions (Signed)
Please continue taking medications as prescribed.  Please continue to follow low salt diet and perform moderate exercise/walking at least 150 mins/week. 

## 2022-06-06 NOTE — Assessment & Plan Note (Signed)
Noted on Vas [place] EVAR f/u Usually benign Check CMP

## 2022-06-06 NOTE — Assessment & Plan Note (Signed)
Followed by Vascular surgery Stable in size

## 2022-06-06 NOTE — Progress Notes (Signed)
Established Patient Office Visit  Subjective:  Patient ID: David Rodriguez, male    DOB: 1946/09/10  Age: 76 y.o. MRN: [id number]  CC:  Chief Complaint  Patient presents with   Annual Exam    CPE    HPI David Rodriguez is a 76 y.o. male with past medical history of CAD, HTN, COPD and tobacco abuse who presents for annual physical.  HTN: BP is well-controlled. Takes medications regularly. Patient denies headache, dizziness, chest pain, dyspnea or palpitations.   COPD: Well-controlled with Anoro and PRN Albuterol. Still smokes about 3-5 cigarettes per day.    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   Hyperlipidemia    Tobacco abuse 10/30/2016    Past Surgical History:  Procedure Laterality Date   ABDOMINAL AORTIC ANEURYSM REPAIR  2013   CARDIAC CATHETERIZATION  2017   TONSILLECTOMY AND ADENOIDECTOMY  1953    Family History  Problem Relation Age of Onset   Hypertension Mother    Cancer Father        MESOTHELIOMA   Cancer Sister        LUNG   Cancer Brother        LUNG    Social History   Socioeconomic History   Marital status: Married    Spouse name: [name]   Number of children: 3   Years of education: 13   Highest education level: Not on file  Occupational History   Occupation: RETIRED    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  Tobacco Use   Smoking status: Every Day    Packs/day: 1.00    Years: 50.00    Total pack years: 50.00    Types: Cigarettes    Last attempt to quit: 08/26/2017    Years since quitting: 4.7    Passive exposure: Never   Smokeless tobacco: Never  Vaping Use   Vaping Use: Never used  Substance and Sexual Activity   Alcohol use: No   Drug use: No   Sexual activity: Not Currently  Other Topics Concern   Not on file  Social History Narrative   Moved from [place]   Retired Games developer   Lives with [place] Determinants of Health   Financial Resource Strain: [place]  (04/26/2022)   Overall Financial  Resource Strain (CARDIA)    Difficulty of Paying Living Expenses: Not hard at all  Food Insecurity: No Food Insecurity (04/26/2022)   Hunger Vital Sign    Worried About Running Out of Food in the Last Year: Never true    [place] in the Last Year: Never true  Transportation Needs: No Transportation Needs (04/26/2022)   PRAPARE - Hydrologist (Medical): No    Lack of Transportation (Non-Medical): No  Physical Activity: Insufficiently Active (04/26/2022)   Exercise Vital Sign    Days of Exercise per Week: 4 days    Minutes of Exercise per Session: 30 min  Stress: No Stress Concern Present (04/26/2022)   [place]    Feeling of Stress : Not at all  Social Connections: Unknown (04/26/2022)   Social Connection and Isolation Panel [NHANES]    Frequency of Communication with Friends and Family: Once a week    Frequency of Social Gatherings with Friends and Family: Once a week    Attends Religious Services: Not on Diplomatic Services operational officer of Clubs or Organizations: No  Attends Archivist Meetings: Not on file    Marital Status: Married  Intimate Partner Violence: Not At Risk (03/27/2021)   Humiliation, Afraid, Rape, and Kick questionnaire    Fear of Current or Ex-Partner: No    Emotionally Abused: No    Physically Abused: No    Sexually Abused: No    Outpatient Medications Prior to Visit  Medication Sig Dispense Refill   albuterol (VENTOLIN HFA) 108 (90 Base) MCG/ACT inhaler Inhale 2 puffs into the lungs every 6 (six) hours as needed for wheezing or shortness of breath. 18 g 5   ANORO ELLIPTA 62.5-25 MCG/ACT AEPB USE 1 INHALATION BY MOUTH DAILY  AT 6AM 120 each 5   atorvastatin (LIPITOR) 40 MG tablet Take 1 tablet (40 mg total) by mouth daily. 90 tablet 3   Cholecalciferol (VITAMIN D-3) 125 MCG (5000 UT) TABS Take 1 tablet by mouth daily.     losartan (COZAAR) 25 MG tablet TAKE 1  TABLET BY MOUTH DAILY 90 tablet 3   sennosides-docusate sodium (SENOKOT-S) 8.6-50 MG tablet Take 1-2 tablets by mouth daily as needed for constipation.     No facility-administered medications prior to visit.    Allergies  Allergen Reactions   Iodine Hives    ROS Review of Systems  Constitutional:  Negative for chills and fever.  HENT:  Negative for congestion and sore throat.   Eyes:  Negative for pain and discharge.  Respiratory:  Negative for cough and shortness of breath.   Cardiovascular:  Negative for chest pain and palpitations.  Gastrointestinal:  Positive for constipation. Negative for diarrhea, nausea and vomiting.  Endocrine: Negative for polydipsia and polyuria.  Genitourinary:  Negative for dysuria and hematuria.  Musculoskeletal:  Negative for neck pain and neck stiffness.  Skin:  Negative for rash.  Neurological:  Negative for dizziness, weakness, numbness and headaches.  Psychiatric/Behavioral:  Negative for agitation and behavioral problems.       Objective:    Physical Exam Vitals reviewed.  Constitutional:      General: He is not in acute distress.    Appearance: He is not diaphoretic.  HENT:     Head: Normocephalic and atraumatic.     Nose: Nose normal.     Mouth/Throat:     Mouth: Mucous membranes are moist.  Eyes:     General: No scleral icterus.    Extraocular Movements: Extraocular movements intact.  Cardiovascular:     Rate and Rhythm: Normal rate and regular rhythm.     Pulses: Normal pulses.     Heart sounds: Normal heart sounds. No murmur heard. Pulmonary:     Breath sounds: Normal breath sounds. No wheezing or rales.  Abdominal:     Palpations: Abdomen is soft.     Tenderness: There is no abdominal tenderness.  Musculoskeletal:     Cervical back: Neck supple. No tenderness.     Right lower leg: Edema (Trace) present.     Left lower leg: Edema (Trace) present.  Skin:    General: Skin is warm.     Findings: No rash.  Neurological:      General: No focal deficit present.     Mental Status: He is alert and oriented to person, place, and time.     Sensory: No sensory deficit.     Motor: No weakness.  Psychiatric:        Mood and Affect: Mood normal.        Behavior: Behavior normal.     BP  124/66 (BP Location: Right Arm, Cuff Size: Normal)   Pulse 72   Ht 5' 9"  (1.753 m)   Wt 178 lb 3.2 oz (80.8 kg)   SpO2 90%   BMI 26.32 kg/m  Wt Readings from Last 3 Encounters:  06/06/22 178 lb 3.2 oz (80.8 kg)  02/28/22 178 lb 1.6 oz (80.8 kg)  12/05/21 179 lb 12.8 oz (81.6 kg)    Lab Results  Component Value Date   TSH 0.87 02/22/2020   Lab Results  Component Value Date   WBC 6.5 09/01/2021   HGB 16.4 09/01/2021   HCT 48.8 09/01/2021   MCV 89 09/01/2021   PLT 256 09/01/2021   Lab Results  Component Value Date   NA 141 10/12/2021   K 4.4 10/12/2021   CO2 24 10/12/2021   GLUCOSE 80 10/12/2021   BUN 16 10/12/2021   CREATININE 0.85 10/12/2021   BILITOT 1.0 09/01/2021   ALKPHOS 84 09/01/2021   AST 15 09/01/2021   ALT 14 09/01/2021   PROT 6.6 09/01/2021   ALBUMIN 4.1 09/01/2021   CALCIUM 9.2 10/12/2021   EGFR 89 10/12/2021   Lab Results  Component Value Date   CHOL 150 09/01/2021   Lab Results  Component Value Date   HDL 43 09/01/2021   Lab Results  Component Value Date   LDLCALC 88 09/01/2021   Lab Results  Component Value Date   TRIG 103 09/01/2021   Lab Results  Component Value Date   CHOLHDL 2.8 03/29/2021   Lab Results  Component Value Date   HGBA1C 5.3 02/22/2020      Assessment & Plan:   Problem List Items Addressed This Visit       Cardiovascular and Mediastinum   Popliteal artery aneurysm (HCC)    Followed by Vascular surgery Stable in size      Essential hypertension    BP Readings from Last 1 Encounters:  06/06/22 124/66  Well-controlled with losartan 25 mg daily now Counseled for compliance with the medications Advised DASH diet and moderate exercise/walking,  at least 150 mins/week      Relevant Orders   TSH     Respiratory   COPD GOLD III     Well-controlled with Anoro and PRN albuterol, refilled        Digestive   Hepatic cyst    Noted on Vas [place] EVAR f/u Usually benign Check CMP      Relevant Orders   CMP14+EGFR     Other   Hyperlipidemia    On statin      Relevant Orders   Lipid panel   Encounter for general adult medical examination with abnormal findings - Primary    Physical exam as documented. Fasting blood tests ordered. Up-to-date with pneumococcal and Shingrix vaccine.      Relevant Orders   TSH   CMP14+EGFR   CBC with Differential/Platelet   Other Visit Diagnoses     Vitamin D deficiency       Relevant Orders   VITAMIN D 25 Hydroxy (Vit-D Deficiency, Fractures)   Prediabetes       Relevant Orders   Hemoglobin A1c       No orders of the defined types were placed in this encounter.   Follow-up: Return in about 6 months (around 12/07/2022) for HTN.    Lindell Spar, MD

## 2022-06-06 NOTE — Assessment & Plan Note (Signed)
BP Readings from Last 1 Encounters:  06/06/22 124/66   Well-controlled with losartan 25 mg daily now Counseled for compliance with the medications Advised DASH diet and moderate exercise/walking, at least 150 mins/week

## 2022-06-06 NOTE — Assessment & Plan Note (Signed)
Well-controlled with Anoro and PRN albuterol, refilled 

## 2022-11-26 DIAGNOSIS — H52203 Unspecified astigmatism, bilateral: Secondary | ICD-10-CM | POA: Diagnosis not present

## 2022-11-26 DIAGNOSIS — H2513 Age-related nuclear cataract, bilateral: Secondary | ICD-10-CM | POA: Diagnosis not present

## 2022-12-07 ENCOUNTER — Ambulatory Visit: Payer: Medicare Other | Admitting: Internal Medicine

## 2022-12-07 DIAGNOSIS — E559 Vitamin D deficiency, unspecified: Secondary | ICD-10-CM | POA: Diagnosis not present

## 2022-12-07 DIAGNOSIS — I10 Essential (primary) hypertension: Secondary | ICD-10-CM | POA: Diagnosis not present

## 2022-12-07 DIAGNOSIS — K7689 Other specified diseases of liver: Secondary | ICD-10-CM | POA: Diagnosis not present

## 2022-12-07 DIAGNOSIS — R7301 Impaired fasting glucose: Secondary | ICD-10-CM | POA: Diagnosis not present

## 2022-12-07 DIAGNOSIS — Z0001 Encounter for general adult medical examination with abnormal findings: Secondary | ICD-10-CM | POA: Diagnosis not present

## 2022-12-07 DIAGNOSIS — E039 Hypothyroidism, unspecified: Secondary | ICD-10-CM | POA: Diagnosis not present

## 2022-12-07 DIAGNOSIS — R7303 Prediabetes: Secondary | ICD-10-CM | POA: Diagnosis not present

## 2022-12-07 DIAGNOSIS — E785 Hyperlipidemia, unspecified: Secondary | ICD-10-CM | POA: Diagnosis not present

## 2022-12-08 LAB — CMP14+EGFR
ALT: 11 IU/L (ref 0–44)
AST: 14 IU/L (ref 0–40)
Albumin/Globulin Ratio: 1.7 (ref 1.2–2.2)
Albumin: 4.4 g/dL (ref 3.8–4.8)
Alkaline Phosphatase: 82 IU/L (ref 44–121)
BUN/Creatinine Ratio: 15 (ref 10–24)
BUN: 14 mg/dL (ref 8–27)
Bilirubin Total: 1 mg/dL (ref 0.0–1.2)
CO2: 25 mmol/L (ref 20–29)
Calcium: 9.5 mg/dL (ref 8.6–10.2)
Chloride: 104 mmol/L (ref 96–106)
Creatinine, Ser: 0.96 mg/dL (ref 0.76–1.27)
Globulin, Total: 2.6 g/dL (ref 1.5–4.5)
Glucose: 86 mg/dL (ref 70–99)
Potassium: 4.6 mmol/L (ref 3.5–5.2)
Sodium: 143 mmol/L (ref 134–144)
Total Protein: 7 g/dL (ref 6.0–8.5)
eGFR: 81 mL/min/{1.73_m2} (ref >59)

## 2022-12-08 LAB — CBC WITH DIFFERENTIAL/PLATELET
Basophils Absolute: 0.1 10*3/uL (ref 0.0–0.2)
Basos: 1 %
EOS (ABSOLUTE): 0.1 10*3/uL (ref 0.0–0.4)
Eos: 2 %
Hematocrit: 51.3 % — ABNORMAL HIGH (ref 37.5–51.0)
Hemoglobin: 17.3 g/dL (ref 13.0–17.7)
Immature Grans (Abs): 0 10*3/uL (ref 0.0–0.1)
Immature Granulocytes: 0 %
Lymphocytes Absolute: 1.9 10*3/uL (ref 0.7–3.1)
Lymphs: 29 %
MCH: 30.5 pg (ref 26.6–33.0)
MCHC: 33.7 g/dL (ref 31.5–35.7)
MCV: 90 fL (ref 79–97)
Monocytes Absolute: 0.7 10*3/uL (ref 0.1–0.9)
Monocytes: 10 %
Neutrophils Absolute: 3.8 10*3/uL (ref 1.4–7.0)
Neutrophils: 58 %
Platelets: 273 10*3/uL (ref 150–450)
RBC: 5.68 x10E6/uL (ref 4.14–5.80)
RDW: 13.3 % (ref 11.6–15.4)
WBC: 6.5 10*3/uL (ref 3.4–10.8)

## 2022-12-08 LAB — LIPID PANEL
Chol/HDL Ratio: 2.9 ratio (ref 0.0–5.0)
Cholesterol, Total: 148 mg/dL (ref 100–199)
HDL: 51 mg/dL (ref >39)
LDL Chol Calc (NIH): 82 mg/dL (ref 0–99)
Triglycerides: 75 mg/dL (ref 0–149)
VLDL Cholesterol Cal: 15 mg/dL (ref 5–40)

## 2022-12-08 LAB — HEMOGLOBIN A1C
Est. average glucose Bld gHb Est-mCnc: 123 mg/dL
Hgb A1c MFr Bld: 5.9 % — ABNORMAL HIGH (ref 4.8–5.6)

## 2022-12-08 LAB — TSH: TSH: 1.19 u[IU]/mL (ref 0.450–4.500)

## 2022-12-08 LAB — VITAMIN D 25 HYDROXY (VIT D DEFICIENCY, FRACTURES): Vit D, 25-Hydroxy: 49.9 ng/mL (ref 30.0–100.0)

## 2022-12-12 ENCOUNTER — Other Ambulatory Visit: Payer: Self-pay

## 2022-12-12 ENCOUNTER — Encounter: Payer: Self-pay | Admitting: Internal Medicine

## 2022-12-12 ENCOUNTER — Ambulatory Visit (INDEPENDENT_AMBULATORY_CARE_PROVIDER_SITE_OTHER): Payer: Medicare Other | Admitting: Internal Medicine

## 2022-12-12 VITALS — BP 139/72 | HR 84 | Ht 69.0 in | Wt 178.4 lb

## 2022-12-12 DIAGNOSIS — I714 Abdominal aortic aneurysm, without rupture, unspecified: Secondary | ICD-10-CM | POA: Diagnosis not present

## 2022-12-12 DIAGNOSIS — I724 Aneurysm of artery of lower extremity: Secondary | ICD-10-CM | POA: Diagnosis not present

## 2022-12-12 DIAGNOSIS — F17210 Nicotine dependence, cigarettes, uncomplicated: Secondary | ICD-10-CM

## 2022-12-12 DIAGNOSIS — J449 Chronic obstructive pulmonary disease, unspecified: Secondary | ICD-10-CM | POA: Diagnosis not present

## 2022-12-12 DIAGNOSIS — K7689 Other specified diseases of liver: Secondary | ICD-10-CM

## 2022-12-12 DIAGNOSIS — Z720 Tobacco use: Secondary | ICD-10-CM

## 2022-12-12 DIAGNOSIS — F4322 Adjustment disorder with anxiety: Secondary | ICD-10-CM | POA: Insufficient documentation

## 2022-12-12 DIAGNOSIS — R7303 Prediabetes: Secondary | ICD-10-CM | POA: Diagnosis not present

## 2022-12-12 DIAGNOSIS — F4321 Adjustment disorder with depressed mood: Secondary | ICD-10-CM | POA: Insufficient documentation

## 2022-12-12 DIAGNOSIS — Z122 Encounter for screening for malignant neoplasm of respiratory organs: Secondary | ICD-10-CM | POA: Insufficient documentation

## 2022-12-12 DIAGNOSIS — I10 Essential (primary) hypertension: Secondary | ICD-10-CM

## 2022-12-12 HISTORY — DX: Encounter for screening for malignant neoplasm of respiratory organs: Z12.2

## 2022-12-12 NOTE — Assessment & Plan Note (Signed)
H/o popliteal aneurysms Followed by Vascular surgery Stable in size

## 2022-12-12 NOTE — Assessment & Plan Note (Signed)
Noted on Vas [place] EVAR f/u Usually benign Checked CMP - liver enzymes wnl

## 2022-12-12 NOTE — Patient Instructions (Addendum)
Please continue to take medications as prescribed.  Please continue to follow low carb diet and perform moderate exercise/walking at least 150 mins/week.  You are being scheduled to get low-dose CT chest.

## 2022-12-12 NOTE — Progress Notes (Signed)
Established Patient Office Visit  Subjective:  Patient ID: David Rodriguez, male    DOB: December 02, 1945  Age: 77 y.o. MRN: [id number]  CC:  Chief Complaint  Patient presents with   Hypertension    Six month follow up     HPI David Rodriguez is a 77 y.o. male with past medical history of CAD, HTN, COPD and tobacco abuse who presents for f/u of his chronic medical conditions.  HTN: BP is well-controlled. Takes medications regularly. Patient denies headache, dizziness, chest pain, dyspnea or palpitations.  COPD: Well-controlled with Anoro and PRN Albuterol. Still smokes about 3-5 cigarettes per day.  He is history of AAA, s/p EVAR.  He denies any abdominal pain currently.  He also has popliteal artery aneurysm, but denies any claudication symptoms currently.  Denies any leg swelling currently.  He is followed by vascular surgery.  He is worried about his wife's medical condition, who is dependent on him for care due to her limited mobility. He has had anxiety and caregiver fatigue. He has difficulty sleeping as well. Denies any SI or HI currently.  Past Medical History:  Diagnosis Date   Allergy    COPD mixed type ([place]) 12/25/2017   Hyperlipidemia    Screening for lung cancer 12/12/2022   Tobacco abuse 10/30/2016    Past Surgical History:  Procedure Laterality Date   ABDOMINAL AORTIC ANEURYSM REPAIR  2013   CARDIAC CATHETERIZATION  2017   TONSILLECTOMY AND ADENOIDECTOMY  1953    Family History  Problem Relation Age of Onset   Hypertension Mother    Cancer Father        MESOTHELIOMA   Cancer Sister        LUNG   Cancer Brother        LUNG    Social History   Socioeconomic History   Marital status: Married    Spouse name: [name]   Number of children: 3   Years of education: 13   Highest education level: Not on file  Occupational History   Occupation: RETIRED    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  Tobacco Use   Smoking status: Every Day    Packs/day: 1.00    Years: 50.00    Total pack  years: 50.00    Types: Cigarettes    Last attempt to quit: 08/26/2017    Years since quitting: 5.2    Passive exposure: Never   Smokeless tobacco: Never  Vaping Use   Vaping Use: Never used  Substance and Sexual Activity   Alcohol use: No   Drug use: No   Sexual activity: Not Currently  Other Topics Concern   Not on file  Social History Narrative   Moved from [place]   Retired Games developer   Lives with [place] Determinants of Health   Financial Resource Strain: [place]  (04/26/2022)   Overall Financial Resource Strain (CARDIA)    Difficulty of Paying Living Expenses: Not hard at all  Food Insecurity: No Food Insecurity (04/26/2022)   Hunger Vital Sign    Worried About Running Out of Food in the Last Year: Never true    Ran Out of Food in the Last Year: Never true  Transportation Needs: No Transportation Needs (04/26/2022)   PRAPARE - Hydrologist (Medical): No    Lack of Transportation (Non-Medical): No  Physical Activity: Insufficiently Active (04/26/2022)   Exercise Vital Sign    Days of Exercise per Week: 4 days    Minutes of  Exercise per Session: 30 min  Stress: No Stress Concern Present (04/26/2022)   [place]    Feeling of Stress : Not at all  Social Connections: Unknown (04/26/2022)   Social Connection and Isolation Panel [NHANES]    Frequency of Communication with Friends and Family: Once a week    Frequency of Social Gatherings with Friends and Family: Once a week    Attends Religious Services: Not on Advertising copywriter or Organizations: No    Attends Archivist Meetings: Not on file    Marital Status: Married  Intimate Partner Violence: Not At Risk (03/27/2021)   Humiliation, Afraid, Rape, and Kick questionnaire    Fear of Current or Ex-Partner: No    Emotionally Abused: No    Physically Abused: No    Sexually Abused: No    Outpatient  Medications Prior to Visit  Medication Sig Dispense Refill   albuterol (VENTOLIN HFA) 108 (90 Base) MCG/ACT inhaler Inhale 2 puffs into the lungs every 6 (six) hours as needed for wheezing or shortness of breath. 18 g 5   ANORO ELLIPTA 62.5-25 MCG/ACT AEPB USE 1 INHALATION BY MOUTH DAILY  AT 6AM 120 each 5   atorvastatin (LIPITOR) 40 MG tablet Take 1 tablet (40 mg total) by mouth daily. 90 tablet 3   Cholecalciferol (VITAMIN D-3) 125 MCG (5000 UT) TABS Take 1 tablet by mouth daily.     losartan (COZAAR) 25 MG tablet TAKE 1 TABLET BY MOUTH DAILY 90 tablet 3   sennosides-docusate sodium (SENOKOT-S) 8.6-50 MG tablet Take 1-2 tablets by mouth daily as needed for constipation.     No facility-administered medications prior to visit.    Allergies  Allergen Reactions   Iodine Hives    ROS Review of Systems  Constitutional:  Negative for chills and fever.  HENT:  Negative for congestion and sore throat.   Eyes:  Negative for pain and discharge.  Respiratory:  Negative for cough and shortness of breath.   Cardiovascular:  Negative for chest pain and palpitations.  Gastrointestinal:  Positive for constipation. Negative for diarrhea, nausea and vomiting.  Endocrine: Negative for polydipsia and polyuria.  Genitourinary:  Negative for dysuria and hematuria.  Musculoskeletal:  Negative for neck pain and neck stiffness.  Skin:  Negative for rash.  Neurological:  Negative for dizziness, weakness, numbness and headaches.  Psychiatric/Behavioral:  Negative for agitation and behavioral problems.       Objective:    Physical Exam Vitals reviewed.  Constitutional:      General: He is not in acute distress.    Appearance: He is not diaphoretic.  HENT:     Head: Normocephalic and atraumatic.     Nose: Nose normal.     Mouth/Throat:     Mouth: Mucous membranes are moist.  Eyes:     General: No scleral icterus.    Extraocular Movements: Extraocular movements intact.  Cardiovascular:     Rate  and Rhythm: Normal rate and regular rhythm.     Pulses: Normal pulses.     Heart sounds: Normal heart sounds. No murmur heard. Pulmonary:     Breath sounds: Normal breath sounds. No wheezing or rales.  Musculoskeletal:     Cervical back: Neck supple. No tenderness.     Right lower leg: No edema.     Left lower leg: No edema.  Skin:    General: Skin is warm.     Findings:  No rash.  Neurological:     General: No focal deficit present.     Mental Status: He is alert and oriented to person, place, and time.     Sensory: No sensory deficit.     Motor: No weakness.  Psychiatric:        Mood and Affect: Mood normal.        Behavior: Behavior normal.     BP 139/72 (BP Location: Right Arm, Patient Position: Sitting, Cuff Size: Large)   Pulse 84   Ht '5\' 9"'$  (1.753 m)   Wt 178 lb 6.4 oz (80.9 kg)   SpO2 92%   BMI 26.35 kg/m  Wt Readings from Last 3 Encounters:  12/12/22 178 lb 6.4 oz (80.9 kg)  06/06/22 178 lb 3.2 oz (80.8 kg)  02/28/22 178 lb 1.6 oz (80.8 kg)    Lab Results  Component Value Date   TSH 1.190 12/07/2022   Lab Results  Component Value Date   WBC 6.5 12/07/2022   HGB 17.3 12/07/2022   HCT 51.3 (H) 12/07/2022   MCV 90 12/07/2022   PLT 273 12/07/2022   Lab Results  Component Value Date   NA 143 12/07/2022   K 4.6 12/07/2022   CO2 25 12/07/2022   GLUCOSE 86 12/07/2022   BUN 14 12/07/2022   CREATININE 0.96 12/07/2022   BILITOT 1.0 12/07/2022   ALKPHOS 82 12/07/2022   AST 14 12/07/2022   ALT 11 12/07/2022   PROT 7.0 12/07/2022   ALBUMIN 4.4 12/07/2022   CALCIUM 9.5 12/07/2022   EGFR 81 12/07/2022   Lab Results  Component Value Date   CHOL 148 12/07/2022   Lab Results  Component Value Date   HDL 51 12/07/2022   Lab Results  Component Value Date   LDLCALC 82 12/07/2022   Lab Results  Component Value Date   TRIG 75 12/07/2022   Lab Results  Component Value Date   CHOLHDL 2.9 12/07/2022   Lab Results  Component Value Date   HGBA1C 5.9  (H) 12/07/2022      Assessment & Plan:   Problem List Items Addressed This Visit       Cardiovascular and Mediastinum   AAA (abdominal aortic aneurysm) without rupture ([place])    History of domino aortic aneurysm repair in 2011 in [place]. Followed by Vascular surgery Needs to quit smoking      Popliteal artery aneurysm (HCC)    H/o popliteal aneurysms Followed by Vascular surgery Stable in size      Essential hypertension - Primary    BP Readings from Last 1 Encounters:  12/12/22 139/72  Well-controlled with losartan 25 mg daily now Counseled for compliance with the medications Advised DASH diet and moderate exercise/walking, at least 150 mins/week        Respiratory   COPD GOLD III     Well-controlled with Anoro and PRN albuterol, refilled        Digestive   Hepatic cyst    Noted on Vas [place] EVAR f/u Usually benign Checked CMP - liver enzymes wnl        Other   Tobacco abuse    Smokes about 0.25 pack/day  Asked about quitting: confirms that he/she currently smokes cigarettes Advise to quit smoking: Educated about QUITTING to reduce the risk of cancer, cardio and cerebrovascular disease. Assess willingness: Unwilling to quit at this time, but is working on cutting back. Assist with counseling and pharmacotherapy: Counseled for 5 minutes and literature provided. Arrange for  follow up: follow up in 3 months and continue to offer help.      Prediabetes    Lab Results  Component Value Date   HGBA1C 5.9 (H) 12/07/2022  Advised to follow low carb diet for now      Screening for lung cancer    Has > 50-pack-year smoking history Ordered low-dose CT chest after discussing with the patient.       Relevant Orders   CT CHEST LUNG CANCER SCREENING LOW DOSE WO CONTRAST   Adjustment disorder with anxious mood    Has caregiver stress He is willing to do [facility] therapy, referral provided      Relevant Orders   Ambulatory referral to Psychiatry    No  orders of the defined types were placed in this encounter.   Follow-up: Return in about 6 months (around 06/12/2023) for Annual physical.    Lindell Spar, MD

## 2022-12-12 NOTE — Assessment & Plan Note (Signed)
Has > 50-pack-year smoking history Ordered low-dose CT chest after discussing with the patient.

## 2022-12-12 NOTE — Assessment & Plan Note (Signed)
Has caregiver stress He is willing to do [facility] therapy, referral provided

## 2022-12-12 NOTE — Assessment & Plan Note (Signed)
History of domino aortic aneurysm repair in 2011 in [place]. Followed by Vascular surgery Needs to quit smoking

## 2022-12-12 NOTE — Assessment & Plan Note (Signed)
BP Readings from Last 1 Encounters:  12/12/22 139/72   Well-controlled with losartan 25 mg daily now Counseled for compliance with the medications Advised DASH diet and moderate exercise/walking, at least 150 mins/week

## 2022-12-12 NOTE — Assessment & Plan Note (Signed)
Well-controlled with Anoro and PRN albuterol, refilled

## 2022-12-12 NOTE — Assessment & Plan Note (Signed)
Smokes about 0.25 pack/day  Asked about quitting: confirms that he/she currently smokes cigarettes Advise to quit smoking: Educated about QUITTING to reduce the risk of cancer, cardio and cerebrovascular disease. Assess willingness: Unwilling to quit at this time, but is working on cutting back. Assist with counseling and pharmacotherapy: Counseled for 5 minutes and literature provided. Arrange for follow up: follow up in 3 months and continue to offer help.

## 2022-12-12 NOTE — Assessment & Plan Note (Signed)
Lab Results  Component Value Date   HGBA1C 5.9 (H) 12/07/2022   Advised to follow low carb diet for now

## 2023-01-08 ENCOUNTER — Telehealth (INDEPENDENT_AMBULATORY_CARE_PROVIDER_SITE_OTHER): Payer: Medicare Other | Admitting: Internal Medicine

## 2023-01-08 ENCOUNTER — Encounter: Payer: Self-pay | Admitting: Internal Medicine

## 2023-01-08 DIAGNOSIS — J441 Chronic obstructive pulmonary disease with (acute) exacerbation: Secondary | ICD-10-CM

## 2023-01-08 MED ORDER — PREDNISONE 20 MG PO TABS: 40.0000 mg | ORAL_TABLET | Freq: Every day | ORAL | 0 refills | Status: DC

## 2023-01-08 NOTE — Progress Notes (Signed)
   Virtual Visit via Video Note  I connected with David Rodriguez on 01/08/23 at 11:40 AM EDT by a video enabled telemedicine application and verified that I am speaking with the correct person using two identifiers.  Patient Location: Home Provider Location: Office/Clinic  I discussed the limitations, risks, security, and privacy concerns of performing an evaluation and management service by video and the availability of in person appointments. I also discussed with the patient that there may be a patient responsible charge related to this service. The patient expressed understanding and agreed to proceed.  Subjective: PCP: David Spar, MD  CC: Cough and shortness of breath  David Rodriguez is a 77 year old living with COPD Gold III who presents for evaluation of cough and shortness of breath he tested positive for COVID approximately a week ago . He has use OTC medications for cough and thought he was improving until last night. Last night his shortness of breath worsened and he used albuterol and found improvement.  The shortness of breath was severe enough he thought about calling for an ambulance. He also is having increased production of sputum. No fever, chill, or chest pain. His wife also tested positive and has had some mild symptoms.   ROS: Per HPI  Current Outpatient Medications:    predniSONE (DELTASONE) 20 MG tablet, Take 2 tablets (40 mg total) by mouth daily with breakfast., Disp: 10 tablet, Rfl: 0   albuterol (VENTOLIN HFA) 108 (90 Base) MCG/ACT inhaler, Inhale 2 puffs into the lungs every 6 (six) hours as needed for wheezing or shortness of breath., Disp: 18 g, Rfl: 5   ANORO ELLIPTA 62.5-25 MCG/ACT AEPB, USE 1 INHALATION BY MOUTH DAILY  AT 6AM, Disp: 120 each, Rfl: 5   atorvastatin (LIPITOR) 40 MG tablet, Take 1 tablet (40 mg total) by mouth daily., Disp: 90 tablet, Rfl: 3   Cholecalciferol (VITAMIN D-3) 125 MCG (5000 UT) TABS, Take 1 tablet by mouth daily., Disp: , Rfl:     losartan (COZAAR) 25 MG tablet, TAKE 1 TABLET BY MOUTH DAILY, Disp: 90 tablet, Rfl: 3   sennosides-docusate sodium (SENOKOT-S) 8.6-50 MG tablet, Take 1-2 tablets by mouth daily as needed for constipation., Disp: , Rfl:   Observations/Objective: There were no vitals filed for this visit.  He is able to talk in complete sentences without shortness of breath.  Assessment and Plan: COPD exacerbation ([place]) Assessment & Plan: Patient has increased shortness of breath and increase in amount of sputum. Continue Anoro and PRN albuterol  Take Prednisone 40 mg daily for 5 days Recommended OTC Mucinex for congestion.  Follow up if symptoms worsen or fail to improve    Orders: -     predniSONE; Take 2 tablets (40 mg total) by mouth daily with breakfast.  Dispense: 10 tablet; Refill: 0    Follow Up Instructions: Return if symptoms worsen or fail to improve.   I discussed the assessment and treatment plan with the patient. The patient was provided an opportunity to ask questions, and all were answered. The patient agreed with the plan and demonstrated an understanding of the instructions.   The patient was advised to call back or seek an in-person evaluation if the symptoms worsen or if the condition fails to improve as anticipated.  The above assessment and management plan was discussed with the patient. The patient verbalized understanding of and has agreed to the management plan.   Lorene Dy, MD

## 2023-01-08 NOTE — Telephone Encounter (Signed)
Scheduled

## 2023-01-08 NOTE — Patient Instructions (Addendum)
Thank you, [name] for allowing [place] to provide your care today.   Exacerbation of your COPD Covid infection has exacerbated your chronic obstructive pulmonary disease. Take prednisone 40 mg for 5 days Continue taking your Anora and as needed albuterol for shortness of breath Take Mucinex for congestion Follow up if symptoms worsen or fail to improve       Tamsen Snider, M.D.

## 2023-01-08 NOTE — Assessment & Plan Note (Addendum)
Patient has increased shortness of breath and increase in amount of sputum. Continue Anoro and PRN albuterol  Take Prednisone 40 mg daily for 5 days Recommended OTC Mucinex for congestion.  Follow up if symptoms worsen or fail to improve

## 2023-01-14 ENCOUNTER — Encounter (HOSPITAL_COMMUNITY): Payer: Self-pay

## 2023-01-14 ENCOUNTER — Ambulatory Visit (HOSPITAL_COMMUNITY)
Admission: RE | Admit: 2023-01-14 | Discharge: 2023-01-14 | Disposition: A | Discharge status: Home / Self Care | Payer: Medicare Other | Source: Ambulatory Visit | Attending: Internal Medicine | Admitting: Internal Medicine

## 2023-01-14 DIAGNOSIS — Z122 Encounter for screening for malignant neoplasm of respiratory organs: Secondary | ICD-10-CM

## 2023-02-26 ENCOUNTER — Other Ambulatory Visit: Payer: Self-pay | Admitting: *Deleted

## 2023-02-26 DIAGNOSIS — I724 Aneurysm of artery of lower extremity: Secondary | ICD-10-CM

## 2023-02-26 DIAGNOSIS — Z9889 Other specified postprocedural states: Secondary | ICD-10-CM

## 2023-03-06 ENCOUNTER — Ambulatory Visit (HOSPITAL_COMMUNITY)
Admission: RE | Admit: 2023-03-06 | Discharge: 2023-03-06 | Disposition: A | Discharge status: Home / Self Care | Payer: Medicare Other | Source: Ambulatory Visit | Attending: Vascular Surgery | Admitting: Vascular Surgery

## 2023-03-06 ENCOUNTER — Ambulatory Visit: Payer: Medicare Other | Admitting: Physician Assistant

## 2023-03-06 ENCOUNTER — Ambulatory Visit (INDEPENDENT_AMBULATORY_CARE_PROVIDER_SITE_OTHER)
Admission: RE | Admit: 2023-03-06 | Discharge: 2023-03-06 | Disposition: A | Discharge status: Home / Self Care | Payer: Medicare Other | Source: Ambulatory Visit | Attending: Vascular Surgery | Admitting: Vascular Surgery

## 2023-03-06 VITALS — BP 147/82 | HR 77 | Temp 98.0°F | Wt 171.0 lb

## 2023-03-06 DIAGNOSIS — Z9889 Other specified postprocedural states: Secondary | ICD-10-CM | POA: Diagnosis not present

## 2023-03-06 DIAGNOSIS — I724 Aneurysm of artery of lower extremity: Secondary | ICD-10-CM | POA: Diagnosis not present

## 2023-03-06 DIAGNOSIS — F17200 Nicotine dependence, unspecified, uncomplicated: Secondary | ICD-10-CM | POA: Diagnosis not present

## 2023-03-06 LAB — VAS US ABI WITH/WO TBI
Left ABI: 1.06
Right ABI: 1.13

## 2023-03-06 NOTE — Progress Notes (Unsigned)
Office Note     CC:  follow up Requesting Provider:  Anabel Halon, MD  HPI: David Rodriguez is a 77 y.o. (May 18, 1946) male who presents for surveillance of endovascular aortic aneurysm repair.  He is also followed for bilateral popliteal artery aneurysms.  EVAR was performed in [place] in 2011.  Since last office visit 1 year ago he denies any new or changing abdominal or back pain.  He also denies any claudication, rest pain, or tissue changes.  He is still an everyday smoker however has cut back from 1 pack/day to 2 cigarettes/day.  He is taking a daily statin.   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   Hyperlipidemia    Screening for lung cancer 12/12/2022   Tobacco abuse 10/30/2016    Past Surgical History:  Procedure Laterality Date   ABDOMINAL AORTIC ANEURYSM REPAIR  2013   CARDIAC CATHETERIZATION  2017   TONSILLECTOMY AND ADENOIDECTOMY  1953    Social History   Socioeconomic History   Marital status: Married    Spouse name: [name]   Number of children: 3   Years of education: 13   Highest education level: Not on file  Occupational History   Occupation: RETIRED    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  Tobacco Use   Smoking status: Every Day    Years: 50    Types: Cigarettes    Last attempt to quit: 08/26/2017    Years since quitting: 5.5    Passive exposure: Never   Smokeless tobacco: Never   Tobacco comments:    2-3 cigarettes  a day   Vaping Use   Vaping Use: Never used  Substance and Sexual Activity   Alcohol use: No   Drug use: No   Sexual activity: Not Currently  Other Topics Concern   Not on file  Social History Narrative   Moved from [place]   Retired Music therapist   Lives with [employer]   Social Determinants of Health   Financial Resource Strain: Low Risk  (04/26/2022)   Overall Financial Resource Strain (CARDIA)    Difficulty of Paying Living Expenses: Not hard at all  Food Insecurity: No Food Insecurity  (04/26/2022)   Hunger Vital Sign    Worried About Running Out of Food in the Last Year: Never true    Ran Out of Food in the Last Year: Never true  Transportation Needs: No Transportation Needs (04/26/2022)   PRAPARE - Administrator, Civil Service (Medical): No    Lack of Transportation (Non-Medical): No  Physical Activity: Insufficiently Active (04/26/2022)   Exercise Vital Sign    Days of Exercise per Week: 4 days    Minutes of Exercise per Session: 30 min  Stress: No Stress Concern Present (04/26/2022)   [employer] of Occupational Health - Occupational Stress Questionnaire    Feeling of Stress : Not at all  Social Connections: Unknown (04/26/2022)   Social Connection and Isolation Panel [NHANES]    Frequency of Communication with Friends and Family: Once a week    Frequency of Social Gatherings with Friends and Family: Once a week    Attends Religious Services: Not on Insurance claims handler of Clubs or Organizations: No    Attends Banker Meetings: Not on file    Marital Status: Married  Intimate Partner Violence: Not At Risk (03/27/2021)   Humiliation, Afraid, Rape, and Kick questionnaire    Fear of  Current or Ex-Partner: No    Emotionally Abused: No    Physically Abused: No    Sexually Abused: No    Family History  Problem Relation Age of Onset   Hypertension Mother    Cancer Father        MESOTHELIOMA   Cancer Sister        LUNG   Cancer Brother        LUNG    Current Outpatient Medications  Medication Sig Dispense Refill   albuterol (VENTOLIN HFA) 108 (90 Base) MCG/ACT inhaler Inhale 2 puffs into the lungs every 6 (six) hours as needed for wheezing or shortness of breath. 18 g 5   ANORO ELLIPTA 62.5-25 MCG/ACT AEPB USE 1 INHALATION BY MOUTH DAILY  AT 6AM 120 each 5   atorvastatin (LIPITOR) 40 MG tablet Take 1 tablet (40 mg total) by mouth daily. 90 tablet 3   Cholecalciferol (VITAMIN D-3) 125 MCG (5000 UT) TABS Take 1 tablet by mouth  daily.     losartan (COZAAR) 25 MG tablet TAKE 1 TABLET BY MOUTH DAILY 90 tablet 3   sennosides-docusate sodium (SENOKOT-S) 8.6-50 MG tablet Take 1-2 tablets by mouth daily as needed for constipation.     No current facility-administered medications for this visit.    Allergies  Allergen Reactions   Iodine Hives     REVIEW OF SYSTEMS:   [X]  denotes positive finding, [ ]  denotes negative finding Cardiac  Comments:  Chest pain or chest pressure:    Shortness of breath upon exertion:    Short of breath when lying flat:    Irregular heart rhythm:        Vascular    Pain in calf, thigh, or hip brought on by ambulation:    Pain in feet at night that wakes you up from your sleep:     Blood clot in your veins:    Leg swelling:         Pulmonary    Oxygen at home:    Productive cough:     Wheezing:         Neurologic    Sudden weakness in arms or legs:     Sudden numbness in arms or legs:     Sudden onset of difficulty speaking or slurred speech:    Temporary loss of vision in one eye:     Problems with dizziness:         Gastrointestinal    Blood in stool:     Vomited blood:         Genitourinary    Burning when urinating:     Blood in urine:        Psychiatric    Major depression:         Hematologic    Bleeding problems:    Problems with blood clotting too easily:        Skin    Rashes or ulcers:        Constitutional    Fever or chills:      PHYSICAL EXAMINATION:  Vitals:   03/06/23 0840  BP: (!) 147/82  Pulse: 77  Temp: 98 F (36.7 C)  TempSrc: Temporal  SpO2: 91%  Weight: 171 lb (77.6 kg)    General:  WDWN in NAD; vital signs documented above Gait: Not observed HENT: WNL, normocephalic Pulmonary: normal non-labored breathing , without Rales, rhonchi,  wheezing Cardiac: regular HR Abdomen: soft, NT, no masses Skin: without rashes Vascular Exam/Pulses: Palpable DP pulses  bilaterally Extremities: without ischemic changes, without Gangrene ,  without cellulitis; without open wounds;  Musculoskeletal: no muscle wasting or atrophy  Neurologic: A&O X 3 Psychiatric:  The pt has Normal affect.   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 male here for follow up for surveillance of EVAR and popliteal aneurysms  -Subjectively the patient has not noticed any new or changing abdominal or back pain.  He also is without any tissue changes to both feet despite popliteal aneurysms.  EVAR duplex demonstrates 4 cm AAA sac without endoleak.  Bilateral lower extremity arterial duplex demonstrates stable appearing popliteal aneurysms.  Right popliteal aneurysm measures 1.7 cm at largest diameter.  No indication for repair at this point.  We will repeat EVAR duplex and bilateral lower extremity arterial duplex with ABIs in 1 year.  He will continue his statin daily.  I recommend he take 81 mg of aspirin daily.  We also encouraged smoking cessation however he has decreased from 1 pack/day to 2 cigarettes/day.  He knows to call/return office sooner with any questions or concerns.   Emilie Rutter, PA-C [facility] [phone]  Clinic MD:   Randie Heinz

## 2023-03-14 ENCOUNTER — Other Ambulatory Visit: Payer: Self-pay

## 2023-03-14 DIAGNOSIS — Z9889 Other specified postprocedural states: Secondary | ICD-10-CM

## 2023-03-14 DIAGNOSIS — I724 Aneurysm of artery of lower extremity: Secondary | ICD-10-CM

## 2023-03-14 NOTE — Addendum Note (Signed)
Addended by: Leilani Able, Nolin Grell A on: 03/14/2023 09:50 AM   Modules accepted: Orders

## 2023-03-15 ENCOUNTER — Ambulatory Visit (HOSPITAL_COMMUNITY): Payer: Medicare Other

## 2023-03-15 ENCOUNTER — Encounter (HOSPITAL_COMMUNITY): Payer: Self-pay

## 2023-04-02 ENCOUNTER — Other Ambulatory Visit: Payer: Self-pay

## 2023-04-02 ENCOUNTER — Encounter: Payer: Self-pay | Admitting: Internal Medicine

## 2023-04-02 DIAGNOSIS — J449 Chronic obstructive pulmonary disease, unspecified: Secondary | ICD-10-CM

## 2023-04-02 MED ORDER — ALBUTEROL SULFATE HFA 108 (90 BASE) MCG/ACT IN AERS: 2.0000 | INHALATION_SPRAY | Freq: Four times a day (QID) | RESPIRATORY_TRACT | 5 refills | Status: DC | PRN

## 2023-05-01 ENCOUNTER — Ambulatory Visit (HOSPITAL_COMMUNITY)
Admission: RE | Admit: 2023-05-01 | Discharge: 2023-05-01 | Disposition: A | Discharge status: Home / Self Care | Payer: Medicare Other | Source: Ambulatory Visit | Attending: Internal Medicine | Admitting: Internal Medicine

## 2023-05-01 DIAGNOSIS — I700 Atherosclerosis of aorta: Secondary | ICD-10-CM | POA: Diagnosis not present

## 2023-05-01 DIAGNOSIS — I2510 Atherosclerotic heart disease of native coronary artery without angina pectoris: Secondary | ICD-10-CM | POA: Insufficient documentation

## 2023-05-01 DIAGNOSIS — F17210 Nicotine dependence, cigarettes, uncomplicated: Secondary | ICD-10-CM | POA: Insufficient documentation

## 2023-05-01 DIAGNOSIS — Z122 Encounter for screening for malignant neoplasm of respiratory organs: Secondary | ICD-10-CM | POA: Insufficient documentation

## 2023-05-01 DIAGNOSIS — I080 Rheumatic disorders of both mitral and aortic valves: Secondary | ICD-10-CM | POA: Insufficient documentation

## 2023-05-01 DIAGNOSIS — J432 Centrilobular emphysema: Secondary | ICD-10-CM | POA: Diagnosis not present

## 2023-05-13 ENCOUNTER — Ambulatory Visit (INDEPENDENT_AMBULATORY_CARE_PROVIDER_SITE_OTHER): Payer: Medicare Other

## 2023-05-13 VITALS — Ht 69.0 in | Wt 170.0 lb

## 2023-05-13 DIAGNOSIS — Z0000 Encounter for general adult medical examination without abnormal findings: Secondary | ICD-10-CM

## 2023-05-13 NOTE — Patient Instructions (Signed)
David Rodriguez , Thank you for taking time to come for your Medicare Wellness Visit. I appreciate your ongoing commitment to your health goals. Please review the following plan we discussed and let me know if I can assist you in the future.   These are the goals we discussed:  Goals       Patient Stated (pt-stated)      Quit smoking       Prevent falls      STOP SMOKING      Patient states has cut back on smoking. Currently smokes 4-5 packs per week        This is a list of the screening recommended for you and due dates:  Health Maintenance  Topic Date Due   COVID-19 Vaccine (6 - 2023-24 season) 06/15/2022   Flu Shot  05/16/2023   Screening for Lung Cancer  04/30/2024   Medicare Annual Wellness Visit  05/12/2024   DTaP/Tdap/Td vaccine (2 - Tdap) 10/15/2024   Pneumonia Vaccine  Completed   Hepatitis C Screening  Completed   Zoster (Shingles) Vaccine  Completed   HPV Vaccine  Aged Out   Colon Cancer Screening  Discontinued    Advanced directives: Information on Advanced Care Planning can be found at [facility] Advance Health Care Directives Advance Health Care Directives ([email or web address]    Conditions/risks identified:  Steps to Quit Smoking Smoking tobacco is the leading cause of preventable death. It can affect almost every organ in the body. Smoking puts you and people around you at risk for many serious, long-lasting (chronic) diseases. Quitting smoking can be hard, but it is one of the best things that you can do for your health. It is never too late to quit. Do not give up if you cannot quit the first time. Some people need to try many times to quit. Do your best to stick to your quit plan, and talk with your doctor if you have any questions or concerns. How do I get ready to quit? Pick a date to quit. Set a date within the next 2 weeks to give you time to prepare. Write down the reasons why you are quitting. Keep this list in places where you will see it  often. Tell your family, friends, and co-workers that you are quitting. Their support is important. Talk with your doctor about the choices that may help you quit. Find out if your health insurance will pay for these treatments. Know the people, places, things, and activities that make you want to smoke (triggers). Avoid them. What first steps can I take to quit smoking? Throw away all cigarettes at home, at work, and in your car. Throw away the things that you use when you smoke, such as ashtrays and lighters. Clean your car. Empty the ashtray. Clean your home, including curtains and carpets. What can I do to help me quit smoking? Talk with your doctor about taking medicines and seeing a counselor. You are more likely to succeed when you do both. If you are pregnant or breastfeeding: Talk with your doctor about counseling or other ways to quit smoking. Do not take medicine to help you quit smoking unless your doctor tells you to. Quit right away Quit smoking completely, instead of slowly cutting back on how much you smoke over a period of time. Stopping smoking right away may be more successful than slowly quitting. Go to counseling. In-person is best if this is an option. You are more likely to  quit if you go to counseling sessions regularly. Take medicine You may take medicines to help you quit. Some medicines need a prescription, and some you can buy over-the-counter. Some medicines may contain a drug called nicotine to replace the nicotine in cigarettes. Medicines may: Help you stop having the desire to smoke (cravings). Help to stop the problems that come when you stop smoking (withdrawal symptoms). Your doctor may ask you to use: Nicotine patches, gum, or lozenges. Nicotine inhalers or sprays. Non-nicotine medicine that you take by mouth. Find resources Find resources and other ways to help you quit smoking and remain smoke-free after you quit. They include: Online chats with a  Veterinary surgeon. Phone quitlines. Printed Materials engineer. Support groups or group counseling. Text messaging programs. Mobile phone apps. Use apps on your mobile phone or tablet that can help you stick to your quit plan. Examples of free services include Quit Guide from the CDC and smokefree.gov  What can I do to make it easier to quit?  Talk to your family and friends. Ask them to support and encourage you. Call a phone quitline, such as 1-800-QUIT-NOW, reach out to support groups, or work with a Veterinary surgeon. Ask people who smoke to not smoke around you. Avoid places that make you want to smoke, such as: Bars. Parties. Smoke-break areas at work. Spend time with people who do not smoke. Lower the stress in your life. Stress can make you want to smoke. Try these things to lower stress: Getting regular exercise. Doing deep-breathing exercises. Doing yoga. Meditating. What benefits will I see if I quit smoking? Over time, you may have: A better sense of smell and taste. Less coughing and sore throat. A slower heart rate. Lower blood pressure. Clearer skin. Better breathing. Fewer sick days. Summary Quitting smoking can be hard, but it is one of the best things that you can do for your health. Do not give up if you cannot quit the first time. Some people need to try many times to quit. When you decide to quit smoking, make a plan to help you succeed. Quit smoking right away, not slowly over a period of time. When you start quitting, get help and support to keep you smoke-free. This information is not intended to replace advice given to you by your health care provider. Make sure you discuss any questions you have with your health care provider. Document Revised: 09/22/2021 Document Reviewed: 09/22/2021 Elsevier Patient Education  2024 Elsevier Inc.    Next appointment: VIRTUAL/ TELEPHONE VISIT Follow up in one year for your annual wellness visit  June 24, 2024 at 230 pm  telephone visit   Preventive Care 65 Years and Older, Male  Preventive care refers to lifestyle choices and visits with your health care provider that can promote health and wellness. What does preventive care include? A yearly physical exam. This is also called an annual well check. Dental exams once or twice a year. Routine eye exams. Ask your health care provider how often you should have your eyes checked. Personal lifestyle choices, including: Daily care of your teeth and gums. Regular physical activity. Eating a healthy diet. Avoiding tobacco and drug use. Limiting alcohol use. Practicing safe sex. Taking low doses of aspirin every day. Taking vitamin and mineral supplements as recommended by your health care provider. What happens during an annual well check? The services and screenings done by your health care provider during your annual well check will depend on your age, overall health, lifestyle risk factors,  and family history of disease. Counseling  Your health care provider may ask you questions about your: Alcohol use. Tobacco use. Drug use. Emotional well-being. Home and relationship well-being. Sexual activity. Eating habits. History of falls. Memory and ability to understand (cognition). Work and work Astronomer. Screening  You may have the following tests or measurements: Height, weight, and BMI. Blood pressure. Lipid and cholesterol levels. These may be checked every 5 years, or more frequently if you are over 67 years old. Skin check. Lung cancer screening. You may have this screening every year starting at age 70 if you have a 30-pack-year history of smoking and currently smoke or have quit within the past 15 years. Fecal occult blood test (FOBT) of the stool. You may have this test every year starting at age 63. Flexible sigmoidoscopy or colonoscopy. You may have a sigmoidoscopy every 5 years or a colonoscopy every 10 years starting at age  62. Prostate cancer screening. Recommendations will vary depending on your family history and other risks. Hepatitis C blood test. Hepatitis B blood test. Sexually transmitted disease (STD) testing. Diabetes screening. This is done by checking your blood sugar (glucose) after you have not eaten for a while (fasting). You may have this done every 1-3 years. Abdominal aortic aneurysm (AAA) screening. You may need this if you are a current or former smoker. Osteoporosis. You may be screened starting at age 66 if you are at high risk. Talk with your health care provider about your test results, treatment options, and if necessary, the need for more tests. Vaccines  Your health care provider may recommend certain vaccines, such as: Influenza vaccine. This is recommended every year. Tetanus, diphtheria, and acellular pertussis (Tdap, Td) vaccine. You may need a Td booster every 10 years. Zoster vaccine. You may need this after age 78. Pneumococcal 13-valent conjugate (PCV13) vaccine. One dose is recommended after age 69. Pneumococcal polysaccharide (PPSV23) vaccine. One dose is recommended after age 24. Talk to your health care provider about which screenings and vaccines you need and how often you need them. This information is not intended to replace advice given to you by your health care provider. Make sure you discuss any questions you have with your health care provider. Document Released: 10/28/2015 Document Revised: 06/20/2016 Document Reviewed: 08/02/2015 Elsevier Interactive Patient Education  2017 [employer].  Fall Prevention in the Home Falls can cause injuries. They can happen to people of all ages. There are many things you can do to make your home safe and to help prevent falls. What can I do on the outside of my home? Regularly fix the edges of walkways and driveways and fix any cracks. Remove anything that might make you trip as you walk through a door, such as a raised step or  threshold. Trim any bushes or trees on the path to your home. Use bright outdoor lighting. Clear any walking paths of anything that might make someone trip, such as rocks or tools. Regularly check to see if handrails are loose or broken. Make sure that both sides of any steps have handrails. Any raised decks and porches should have guardrails on the edges. Have any leaves, snow, or ice cleared regularly. Use sand or salt on walking paths during winter. Clean up any spills in your garage right away. This includes oil or grease spills. What can I do in the bathroom? Use night lights. Install grab bars by the toilet and in the tub and shower. Do not use towel bars as  grab bars. Use non-skid mats or decals in the tub or shower. If you need to sit down in the shower, use a plastic, non-slip stool. Keep the floor dry. Clean up any water that spills on the floor as soon as it happens. Remove soap buildup in the tub or shower regularly. Attach bath mats securely with double-sided non-slip rug tape. Do not have throw rugs and other things on the floor that can make you trip. What can I do in the bedroom? Use night lights. Make sure that you have a light by your bed that is easy to reach. Do not use any sheets or blankets that are too big for your bed. They should not hang down onto the floor. Have a firm chair that has side arms. You can use this for support while you get dressed. Do not have throw rugs and other things on the floor that can make you trip. What can I do in the kitchen? Clean up any spills right away. Avoid walking on wet floors. Keep items that you use a lot in easy-to-reach places. If you need to reach something above you, use a strong step stool that has a grab bar. Keep electrical cords out of the way. Do not use floor polish or wax that makes floors slippery. If you must use wax, use non-skid floor wax. Do not have throw rugs and other things on the floor that can make you  trip. What can I do with my stairs? Do not leave any items on the stairs. Make sure that there are handrails on both sides of the stairs and use them. Fix handrails that are broken or loose. Make sure that handrails are as long as the stairways. Check any carpeting to make sure that it is firmly attached to the stairs. Fix any carpet that is loose or worn. Avoid having throw rugs at the top or bottom of the stairs. If you do have throw rugs, attach them to the floor with carpet tape. Make sure that you have a light switch at the top of the stairs and the bottom of the stairs. If you do not have them, ask someone to add them for you. What else can I do to help prevent falls? Wear shoes that: Do not have high heels. Have rubber bottoms. Are comfortable and fit you well. Are closed at the toe. Do not wear sandals. If you use a stepladder: Make sure that it is fully opened. Do not climb a closed stepladder. Make sure that both sides of the stepladder are locked into place. Ask someone to hold it for you, if possible. Clearly mark and make sure that you can see: Any grab bars or handrails. First and last steps. Where the edge of each step is. Use tools that help you move around (mobility aids) if they are needed. These include: Canes. Walkers. Scooters. Crutches. Turn on the lights when you go into a dark area. Replace any light bulbs as soon as they burn out. Set up your furniture so you have a clear path. Avoid moving your furniture around. If any of your floors are uneven, fix them. If there are any pets around you, be aware of where they are. Review your medicines with your doctor. Some medicines can make you feel dizzy. This can increase your chance of falling. Ask your doctor what other things that you can do to help prevent falls. This information is not intended to replace advice given to you by your  health care provider. Make sure you discuss any questions you have with your  health care provider. Document Released: 07/28/2009 Document Revised: 03/08/2016 Document Reviewed: 11/05/2014 Elsevier Interactive Patient Education  2017 [employer].

## 2023-05-13 NOTE — Progress Notes (Signed)
Because this visit was a virtual/telehealth visit,  certain criteria was not obtained, such a blood pressure, CBG if patient is a diabetic, and timed up and go. Any medications not marked as "taking" was not mentioned during the medication reconciliation part of the visit. Any vitals not documented were not able to be obtained due to this being a telehealth visit. Vitals documented are verbally provided by the patient.  Per patient no change in vitals since last visit, unable to obtain new vitals due to telehealth visit.   Subjective:   David Rodriguez is a 77 y.o. male who presents for Medicare Annual/Subsequent preventive examination.  Visit Complete: Virtual  I connected with  David Rodriguez on 05/13/23 by a audio enabled telemedicine application and verified that I am speaking with the correct person using two identifiers.  Patient Location: Home  Provider Location: Home Office  I discussed the limitations of evaluation and management by telemedicine. The patient expressed understanding and agreed to proceed.  Patient Medicare AWV questionnaire was completed by the patient on 05/06/2023; I have confirmed that all information answered by patient is correct and no changes since this date.  Review of Systems     Cardiac Risk Factors include: advanced age (>75men, >65 women);dyslipidemia;hypertension;male gender;smoking/ tobacco exposure     Objective:    Today's Vitals   05/13/23 1435  Weight: 170 lb (77.1 kg)  Height: 5\' 9"  (1.753 m)   Body mass index is 25.1 kg/m.     05/13/2023    2:38 PM 04/27/2022    1:37 PM 03/27/2021    8:22 AM  Advanced Directives  Does Patient Have a Medical Advance Directive? Yes Yes Yes  Type of Estate agent of [place];Living will  Healthcare Power of [place];Living will  Does patient want to make changes to medical advance directive? No - Patient declined No - Patient declined   Copy of Healthcare Power of Attorney in Chart? Yes  - validated most recent copy scanned in chart (See row information)  No - copy requested    Current Medications (verified) Outpatient Encounter Medications as of 05/13/2023  Medication Sig   albuterol (VENTOLIN HFA) 108 (90 Base) MCG/ACT inhaler Inhale 2 puffs into the lungs every 6 (six) hours as needed for wheezing or shortness of breath.   ANORO ELLIPTA 62.5-25 MCG/ACT AEPB USE 1 INHALATION BY MOUTH DAILY  AT 6AM   aspirin EC 81 MG tablet Take 81 mg by mouth daily.   atorvastatin (LIPITOR) 40 MG tablet Take 1 tablet (40 mg total) by mouth daily.   Cholecalciferol (VITAMIN D-3) 125 MCG (5000 UT) TABS Take 1 tablet by mouth daily.   losartan (COZAAR) 25 MG tablet TAKE 1 TABLET BY MOUTH DAILY   sennosides-docusate sodium (SENOKOT-S) 8.6-50 MG tablet Take 1-2 tablets by mouth daily as needed for constipation. (Patient not taking: Reported on 05/13/2023)   No facility-administered encounter medications on file as of 05/13/2023.    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   Hyperlipidemia    Screening for lung cancer 12/12/2022   Tobacco abuse 10/30/2016   Past Surgical History:  Procedure Laterality Date   ABDOMINAL AORTIC ANEURYSM REPAIR  2013   CARDIAC CATHETERIZATION  2017   TONSILLECTOMY AND ADENOIDECTOMY  1953   Family History  Problem Relation Age of Onset   Hypertension Mother    Cancer Father  MESOTHELIOMA   Cancer Sister        LUNG   Cancer Brother        LUNG   Social History   Socioeconomic History   Marital status: Married    Spouse name: David Rodriguez   Number of children: 3   Years of education: 13   Highest education level: Not on file  Occupational History   Occupation: RETIRED    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  Tobacco Use   Smoking status: Every Day    Current packs/day: 0.00    Average packs/day: 0.5 packs/day for 50.0 years (25.0 ttl pk-yrs)    Types: Cigarettes    Start  date: 08/27/1967    Last attempt to quit: 08/26/2017    Years since quitting: 5.7    Passive exposure: Never   Smokeless tobacco: Never   Tobacco comments:    2-3 cigarettes  a day   Vaping Use   Vaping status: Never Used  Substance and Sexual Activity   Alcohol use: No   Drug use: No   Sexual activity: Not Currently  Other Topics Concern   Not on file  Social History Narrative   Moved from [place]   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   Social Determinants of Health   Financial Resource Strain: Low Risk  (05/13/2023)   Overall Financial Resource Strain (CARDIA)    Difficulty of Paying Living Expenses: Not hard at all  Food Insecurity: No Food Insecurity (05/13/2023)   Hunger Vital Sign    Worried About Running Out of Food in the Last Year: Never true    Ran Out of Food in the Last Year: Never true  Transportation Needs: No Transportation Needs (05/13/2023)   PRAPARE - Administrator, Civil Service (Medical): No    Lack of Transportation (Non-Medical): No  Physical Activity: Insufficiently Active (05/13/2023)   Exercise Vital Sign    Days of Exercise per Week: 3 days    Minutes of Exercise per Session: 30 min  Stress: No Stress Concern Present (05/13/2023)   [employer] of Occupational Health - Occupational Stress Questionnaire    Feeling of Stress : Not at all  Social Connections: Unknown (05/13/2023)   Social Connection and Isolation Panel [NHANES]    Frequency of Communication with Friends and Family: Three times a week    Frequency of Social Gatherings with Friends and Family: Twice a week    Attends Religious Services: Not on Marketing executive or Organizations: No    Attends Banker Meetings: Never    Marital Status: Married    Tobacco Counseling Ready to quit: Yes Counseling given: Yes Tobacco comments: 2-3 cigarettes  a day    Clinical Intake:  Pre-visit preparation completed: Yes  Pain : No/denies pain      BMI - recorded: 25.1 Nutritional Status: BMI 25 -29 Overweight Nutritional Risks: None Diabetes: No  How often do you need to have someone help you when you read instructions, pamphlets, or other written materials from your doctor or pharmacy?: 1 - Never  Interpreter Needed?: No  Information entered by :: Abby Alexey Rhoads, CMA   Activities of Daily Living    05/13/2023    2:44 PM 05/06/2023    9:20 AM  In your present state of health, do you have any difficulty performing the following activities:  Hearing? 0 0  Vision? 0 0  Difficulty concentrating or making decisions? 0 0  Walking or climbing stairs?  0 0  Dressing or bathing? 0 0  Doing errands, shopping? 0 0  Preparing Food and eating ? N N  Using the Toilet? N N  In the past six months, have you accidently leaked urine? N N  Do you have problems with loss of bowel control? N N  Managing your Medications? N N  Managing your Finances? N N  Housekeeping or managing your Housekeeping? N N    Patient Care Team: Anabel Halon, MD as PCP - General (Internal Medicine) Laqueta Linden, MD (Inactive) as PCP - Cardiology (Cardiology)  Indicate any recent Medical Services you may have received from other than [facility] providers in the past year (date may be approximate).     Assessment:   This is a routine wellness examination for [place].  Hearing/Vision screen Hearing Screening - Comments:: Patient denies any hearing difficulties.    Dietary issues and exercise activities discussed:     Goals Addressed               This Visit's Progress     Patient Stated (pt-stated)        Quit smoking        Depression Screen    05/13/2023    2:39 PM 12/12/2022    9:19 AM 06/06/2022    8:04 AM 12/05/2021    9:02 AM 09/04/2021    1:01 PM 07/04/2021    9:55 AM 03/27/2021    8:25 AM  PHQ 2/9 Scores  PHQ - 2 Score 1 1 1  0 0 0 0  PHQ- 9 Score  4     0    Fall Risk    05/13/2023    2:43 PM 05/06/2023    9:20 AM  12/12/2022    9:19 AM 06/06/2022    8:04 AM 04/26/2022    9:24 PM  Fall Risk   Falls in the past year? 0 0 0 0 0  Number falls in past yr: 0  0 0   Injury with Fall? 0  0 0   Risk for fall due to : No Fall Risks   No Fall Risks   Follow up Falls prevention discussed   Falls evaluation completed     MEDICARE RISK AT HOME:  Medicare Risk at Home - 05/13/23 1443     Any stairs in or around the home? Yes    If so, are there any without handrails? No    Home free of loose throw rugs in walkways, pet beds, electrical cords, etc? Yes    Adequate lighting in your home to reduce risk of falls? Yes    Life alert? No    Use of a cane, walker or w/c? No    Grab bars in the bathroom? Yes    Shower chair or bench in shower? No    Elevated toilet seat or a handicapped toilet? No             TIMED UP AND GO:  Was the test performed?  No    Cognitive Function:        05/13/2023    2:43 PM 04/27/2022    2:53 PM  6CIT Screen  What Year? 0 points 0 points  What month? 0 points 0 points  What time? 0 points 0 points  Count back from 20 0 points 0 points  Months in reverse 0 points 0 points  Repeat phrase 0 points 0 points  Total Score 0 points 0 points  Immunizations Immunization History  Administered Date(s) Administered   Fluad Quad(high Dose 65+) 07/29/2019, 07/20/2020, 07/04/2021   Influenza-Unspecified 07/14/2018, 07/29/2019, 07/04/2021   Moderna Sars-Covid-2 Vaccination 01/13/2021   PFIZER(Purple Top)SARS-COV-2 Vaccination 11/19/2019, 12/15/2019, 07/05/2020, 06/27/2021   Pneumococcal Conjugate-13 08/03/2015   Pneumococcal Polysaccharide-23 02/22/2020   Td 10/15/2014   Zoster Recombinant(Shingrix) 02/05/2022, 04/10/2022    TDAP status: Up to date  Flu Vaccine status: Up to date  Pneumococcal vaccine status: Up to date  Covid-19 vaccine status: Information provided on how to obtain vaccines.   Qualifies for Shingles Vaccine? Yes   Zostavax completed Yes    Shingrix Completed?: Yes  Screening Tests Health Maintenance  Topic Date Due   COVID-19 Vaccine (6 - 2023-24 season) 06/15/2022   Medicare Annual Wellness (AWV)  04/28/2023   INFLUENZA VACCINE  05/16/2023   DTaP/Tdap/Td (2 - Tdap) 10/15/2024   Pneumonia Vaccine 71+ Years old  Completed   Hepatitis C Screening  Completed   Zoster Vaccines- Shingrix  Completed   HPV VACCINES  Aged Out   Lung Cancer Screening  Discontinued   Colonoscopy  Discontinued    Health Maintenance  Health Maintenance Due  Topic Date Due   COVID-19 Vaccine (6 - 2023-24 season) 06/15/2022   Medicare Annual Wellness (AWV)  04/28/2023    Colorectal cancer screening: Type of screening: Colonoscopy. Completed 10/31/2011. Repeat every discontinued by provider years  Lung Cancer Screening: (Low Dose CT Chest recommended if Age 13-80 years, 20 pack-year currently smoking OR have quit w/in 15years.) does qualify.   Lung Cancer Screening Referral: Had screening on 05/01/2023  Additional Screening:  Hepatitis C Screening: does not qualify; Completed 06/03/2017  Vision Screening: Recommended annual ophthalmology exams for early detection of glaucoma and other disorders of the eye. Is the patient up to date with their annual eye exam?  Yes  Who is the provider or what is the name of the office in which the patient attends annual eye exams? Dr. Emily Filbert If pt is not established with a provider, would they like to be referred to a provider to establish care? No .   Dental Screening: Recommended annual dental exams for proper oral hygiene  Diabetic Foot Exam: n/a  Community Resource Referral / Chronic Care Management: CRR required this visit?  No   CCM required this visit?  No     Plan:     I have personally reviewed and noted the following in the patient's chart:   Medical and social history Use of alcohol, tobacco or illicit drugs  Current medications and supplements including opioid prescriptions. Patient  is not currently taking opioid prescriptions. Functional ability and status Nutritional status Physical activity Advanced directives List of other physicians Hospitalizations, surgeries, and ER visits in previous 12 months Vitals Screenings to include cognitive, depression, and falls Referrals and appointments  In addition, I have reviewed and discussed with patient certain preventive protocols, quality metrics, and best practice recommendations. A written personalized care plan for preventive services as well as general preventive health recommendations were provided to patient.     Jordan Hawks Nikodem Leadbetter, CMA   05/13/2023   After Visit Summary: (MyChart) Due to this being a telephonic visit, the after visit summary with patients personalized plan was offered to patient via MyChart   Nurse Notes: Health Maint utd

## 2023-06-13 ENCOUNTER — Encounter: Payer: Self-pay | Admitting: Internal Medicine

## 2023-06-13 ENCOUNTER — Ambulatory Visit (INDEPENDENT_AMBULATORY_CARE_PROVIDER_SITE_OTHER): Payer: Medicare Other | Admitting: Internal Medicine

## 2023-06-13 VITALS — BP 135/70 | HR 75 | Resp 16 | Ht 69.0 in | Wt 175.0 lb

## 2023-06-13 DIAGNOSIS — I358 Other nonrheumatic aortic valve disorders: Secondary | ICD-10-CM | POA: Diagnosis not present

## 2023-06-13 DIAGNOSIS — K4021 Bilateral inguinal hernia, without obstruction or gangrene, recurrent: Secondary | ICD-10-CM | POA: Diagnosis not present

## 2023-06-13 DIAGNOSIS — I10 Essential (primary) hypertension: Secondary | ICD-10-CM | POA: Diagnosis not present

## 2023-06-13 DIAGNOSIS — Z23 Encounter for immunization: Secondary | ICD-10-CM

## 2023-06-13 DIAGNOSIS — Z125 Encounter for screening for malignant neoplasm of prostate: Secondary | ICD-10-CM

## 2023-06-13 DIAGNOSIS — J432 Centrilobular emphysema: Secondary | ICD-10-CM | POA: Diagnosis not present

## 2023-06-13 DIAGNOSIS — Z0001 Encounter for general adult medical examination with abnormal findings: Secondary | ICD-10-CM

## 2023-06-13 DIAGNOSIS — I714 Abdominal aortic aneurysm, without rupture, unspecified: Secondary | ICD-10-CM | POA: Diagnosis not present

## 2023-06-13 NOTE — Patient Instructions (Signed)
Please continue to take medications as prescribed.  Please continue to follow low salt diet and perform moderate exercise/walking at least 150 mins/week.  You are being scheduled to get Echocardiogram.  You are being referred to General surgery for inguinal hernia.

## 2023-06-13 NOTE — Assessment & Plan Note (Addendum)
Ordered PSA after discussing its limitations for prostate cancer screening, including false positive results leading to additional investigations. 

## 2023-06-13 NOTE — Assessment & Plan Note (Signed)
Noted on low-dose CT chest Check echocardiography to rule out aortic valve stenosis Has underlying dyspnea, likely due to COPD

## 2023-06-13 NOTE — Assessment & Plan Note (Addendum)
BP Readings from Last 1 Encounters:  06/13/23 135/70   Well-controlled with losartan 25 mg daily now Counseled for compliance with the medications Advised DASH diet and moderate exercise/walking, at least 150 mins/week

## 2023-06-13 NOTE — Assessment & Plan Note (Signed)
Physical exam as documented. Fasting blood tests ordered. Up-to-date with pneumococcal and Shingrix vaccine.

## 2023-06-13 NOTE — Progress Notes (Addendum)
 Established Patient Office Visit  Subjective:  Patient ID: David Rodriguez, male    DOB: 1946-06-13  Age: 77 y.o. MRN: [id number]  CC:  Chief Complaint  Patient presents with   Annual Exam    HPI David Rodriguez is a 77 y.o. male with past medical history of CAD, HTN, COPD and tobacco abuse who presents for annual physical.  HTN: BP is well-controlled. Takes medications regularly. Patient denies headache, dizziness, chest pain, or palpitations.   COPD: Well-controlled with Anoro and PRN Albuterol. Still smokes about 3-5 cigarettes per day. His low-dose CT chest showed emphysema and pulmonary nodules.  It also showed calcification of aortic valve and mitral annulus.  He has mild dyspnea, but reports that he feels better on the days he does not smoke.  Denies any orthopnea or PND currently.  He is history of AAA, s/p EVAR.  He denies any abdominal pain currently.  He also has popliteal artery aneurysm, but denies any claudication symptoms currently.  Denies any leg swelling currently.  He is followed by vascular surgery.   He is worried about his wife's medical condition, who is dependent on him for care due to her limited mobility. He has had anxiety and caregiver fatigue. He has difficulty sleeping as well. Denies any SI or HI currently.  He has history of bilateral inguinal hernia, but his right sided inguinal hernia has been getting larger lately.  Denies any scrotal area pain or discomfort.  He has been evaluated by general surgeon in 2021, and was told to have watchful observation, but it has grown since then.  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   Hyperlipidemia    Screening for lung cancer 12/12/2022   Tobacco abuse 10/30/2016    Past Surgical History:  Procedure Laterality Date   ABDOMINAL AORTIC ANEURYSM REPAIR  2013   CARDIAC CATHETERIZATION  2017   TONSILLECTOMY AND ADENOIDECTOMY  1953    Family History  Problem Relation  Age of Onset   Hypertension Mother    Cancer Father        MESOTHELIOMA   Cancer Sister        LUNG   Cancer Brother        LUNG    Social History   Socioeconomic History   Marital status: Married    Spouse name: [name]   Number of children: 3   Years of education: 13   Highest education level: Not on file  Occupational History   Occupation: RETIRED    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  Tobacco Use   Smoking status: Every Day    Current packs/day: 0.00    Average packs/day: 0.5 packs/day for 50.0 years (25.0 ttl pk-yrs)    Types: Cigarettes    Start date: 08/27/1967    Last attempt to quit: 08/26/2017    Years since quitting: 5.8    Passive exposure: Never   Smokeless tobacco: Never   Tobacco comments:    2-3 cigarettes  a day   Vaping Use   Vaping status: Never Used  Substance and Sexual Activity   Alcohol use: No   Drug use: No   Sexual activity: Not Currently  Other Topics Concern   Not on file  Social History Narrative   Moved from [place]   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   Social Determinants of Health   Financial Resource Strain: Low Risk  (05/13/2023)   Overall Financial Resource Strain (CARDIA)  Difficulty of Paying Living Expenses: Not hard at all  Food Insecurity: No Food Insecurity (05/13/2023)   Hunger Vital Sign    Worried About Running Out of Food in the Last Year: Never true    Ran Out of Food in the Last Year: Never true  Transportation Needs: No Transportation Needs (05/13/2023)   PRAPARE - Administrator, Civil Service (Medical): No    Lack of Transportation (Non-Medical): No  Physical Activity: Insufficiently Active (05/13/2023)   Exercise Vital Sign    Days of Exercise per Week: 3 days    Minutes of Exercise per Session: 30 min  Stress: No Stress Concern Present (05/13/2023)   [employer] of Occupational Health - Occupational Stress Questionnaire    Feeling of Stress : Not at all  Social Connections: Unknown (05/13/2023)    Social Connection and Isolation Panel [NHANES]    Frequency of Communication with Friends and Family: Three times a week    Frequency of Social Gatherings with Friends and Family: Twice a week    Attends Religious Services: Not on Marketing executive or Organizations: No    Attends Banker Meetings: Never    Marital Status: Married  Catering manager Violence: Not At Risk (05/13/2023)   Humiliation, Afraid, Rape, and Kick questionnaire    Fear of Current or Ex-Partner: No    Emotionally Abused: No    Physically Abused: No    Sexually Abused: No    Outpatient Medications Prior to Visit  Medication Sig Dispense Refill   albuterol (VENTOLIN HFA) 108 (90 Base) MCG/ACT inhaler Inhale 2 puffs into the lungs every 6 (six) hours as needed for wheezing or shortness of breath. 18 g 5   ANORO ELLIPTA 62.5-25 MCG/ACT AEPB USE 1 INHALATION BY MOUTH DAILY  AT 6AM 120 each 5   aspirin EC 81 MG tablet Take 81 mg by mouth daily.     atorvastatin (LIPITOR) 40 MG tablet Take 1 tablet (40 mg total) by mouth daily. 90 tablet 3   Cholecalciferol (VITAMIN D-3) 125 MCG (5000 UT) TABS Take 1 tablet by mouth daily.     losartan (COZAAR) 25 MG tablet TAKE 1 TABLET BY MOUTH DAILY 90 tablet 3   sennosides-docusate sodium (SENOKOT-S) 8.6-50 MG tablet Take 1-2 tablets by mouth daily as needed for constipation.     No facility-administered medications prior to visit.    Allergies  Allergen Reactions   Iodine Hives    ROS Review of Systems  Constitutional:  Negative for chills and fever.  HENT:  Negative for congestion and sore throat.   Eyes:  Negative for pain and discharge.  Respiratory:  Positive for shortness of breath. Negative for cough.   Cardiovascular:  Negative for chest pain and palpitations.  Gastrointestinal:  Positive for constipation. Negative for diarrhea, nausea and vomiting.  Endocrine: Negative for polydipsia and polyuria.  Genitourinary:  Negative for dysuria and  hematuria.  Musculoskeletal:  Negative for neck pain and neck stiffness.  Skin:  Negative for rash.  Neurological:  Negative for dizziness, weakness, numbness and headaches.  Psychiatric/Behavioral:  Negative for agitation and behavioral problems.       Objective:    Physical Exam Vitals reviewed.  Constitutional:      General: He is not in acute distress.    Appearance: He is not diaphoretic.  HENT:     Head: Normocephalic and atraumatic.     Nose: Nose normal.     Mouth/Throat:  Mouth: Mucous membranes are moist.  Eyes:     General: No scleral icterus.    Extraocular Movements: Extraocular movements intact.  Cardiovascular:     Rate and Rhythm: Normal rate and regular rhythm.     Pulses: Normal pulses.     Heart sounds: Normal heart sounds. No murmur heard. Pulmonary:     Breath sounds: Normal breath sounds. No wheezing or rales.  Abdominal:     Palpations: Abdomen is soft.     Tenderness: There is no abdominal tenderness.     Hernia: A hernia (R > L inguinal hernia) is present.  Musculoskeletal:     Cervical back: Neck supple. No tenderness.     Right lower leg: Edema (Trace) present.     Left lower leg: Edema (Trace) present.  Skin:    General: Skin is warm.     Findings: No rash.  Neurological:     General: No focal deficit present.     Mental Status: He is alert and oriented to person, place, and time.     Sensory: No sensory deficit.     Motor: No weakness.  Psychiatric:        Mood and Affect: Mood normal.        Behavior: Behavior normal.     BP 135/70   Pulse 75   Resp 16   Ht 5\' 9"  (1.753 m)   Wt 175 lb (79.4 kg)   SpO2 91%   BMI 25.84 kg/m  Wt Readings from Last 3 Encounters:  06/13/23 175 lb (79.4 kg)  05/13/23 170 lb (77.1 kg)  03/06/23 171 lb (77.6 kg)    Lab Results  Component Value Date   TSH 1.190 12/07/2022   Lab Results  Component Value Date   WBC 6.5 12/07/2022   HGB 17.3 12/07/2022   HCT 51.3 (H) 12/07/2022   MCV 90  12/07/2022   PLT 273 12/07/2022   Lab Results  Component Value Date   NA 143 12/07/2022   K 4.6 12/07/2022   CO2 25 12/07/2022   GLUCOSE 86 12/07/2022   BUN 14 12/07/2022   CREATININE 0.96 12/07/2022   BILITOT 1.0 12/07/2022   ALKPHOS 82 12/07/2022   AST 14 12/07/2022   ALT 11 12/07/2022   PROT 7.0 12/07/2022   ALBUMIN 4.4 12/07/2022   CALCIUM 9.5 12/07/2022   EGFR 81 12/07/2022   Lab Results  Component Value Date   CHOL 148 12/07/2022   Lab Results  Component Value Date   HDL 51 12/07/2022   Lab Results  Component Value Date   LDLCALC 82 12/07/2022   Lab Results  Component Value Date   TRIG 75 12/07/2022   Lab Results  Component Value Date   CHOLHDL 2.9 12/07/2022   Lab Results  Component Value Date   HGBA1C 5.9 (H) 12/07/2022      Assessment & Plan:   Problem List Items Addressed This Visit       Cardiovascular and Mediastinum   AAA (abdominal aortic aneurysm) without rupture (HCC)    History of domino aortic aneurysm repair in 2011 in [place]. Followed by Vascular surgery Needs to quit smoking      Essential hypertension    BP Readings from Last 1 Encounters:  06/13/23 135/70   Well-controlled with losartan 25 mg daily now Counseled for compliance with the medications Advised DASH diet and moderate exercise/walking, at least 150 mins/week      Relevant Orders   Basic Metabolic Panel (BMET)  Aortic valve sclerosis    Noted on low-dose CT chest Check echocardiography to rule out aortic valve stenosis Has underlying dyspnea, likely due to COPD      Relevant Orders   ECHOCARDIOGRAM COMPLETE     Respiratory   COPD (chronic obstructive pulmonary disease) (HCC) - Primary    Well-controlled with Anoro and PRN albuterol, refilled        Other   Bilateral recurrent inguinal hernia without obstruction or gangrene    Has been growing in size Needs repeat evaluation by general surgeon-referral provided Avoid heavy lifting and  frequent bending No abdominal pain or concern for bowel obstruction currently      Relevant Orders   Ambulatory referral to General Surgery   Encounter for general adult medical examination with abnormal findings    Physical exam as documented. Fasting blood tests ordered. Up-to-date with pneumococcal and Shingrix vaccine.      Prostate cancer screening    Ordered PSA after discussing its limitations for prostate cancer screening, including false positive results leading to additional investigations.      Relevant Orders   PSA   Other Visit Diagnoses     Encounter for immunization       Relevant Orders   Flu Vaccine Trivalent High Dose (Fluad) (Completed)        No orders of the defined types were placed in this encounter.   Follow-up: Return in about 6 months (around 12/13/2023) for HTN and COPD.    Anabel Halon, MD

## 2023-06-13 NOTE — Assessment & Plan Note (Signed)
History of domino aortic aneurysm repair in 2011 in [place]. Followed by Vascular surgery Needs to quit smoking

## 2023-06-13 NOTE — Assessment & Plan Note (Signed)
Well-controlled with Anoro and PRN albuterol, refilled 

## 2023-06-13 NOTE — Assessment & Plan Note (Addendum)
Has been growing in size Needs repeat evaluation by general surgeon-referral provided Avoid heavy lifting and frequent bending No abdominal pain or concern for bowel obstruction currently

## 2023-06-14 LAB — BASIC METABOLIC PANEL
BUN/Creatinine Ratio: 13 (ref 10–24)
BUN: 12 mg/dL (ref 8–27)
CO2: 23 mmol/L (ref 20–29)
Calcium: 9.3 mg/dL (ref 8.6–10.2)
Chloride: 99 mmol/L (ref 96–106)
Creatinine, Ser: 0.9 mg/dL (ref 0.76–1.27)
Glucose: 85 mg/dL (ref 70–99)
Potassium: 4.6 mmol/L (ref 3.5–5.2)
Sodium: 137 mmol/L (ref 134–144)
eGFR: 87 mL/min/{1.73_m2} (ref >59)

## 2023-06-14 LAB — PSA: Prostate Specific Ag, Serum: 4.1 ng/mL — ABNORMAL HIGH (ref 0.0–4.0)

## 2023-07-06 ENCOUNTER — Other Ambulatory Visit: Payer: Self-pay | Admitting: Internal Medicine

## 2023-07-06 DIAGNOSIS — E785 Hyperlipidemia, unspecified: Secondary | ICD-10-CM

## 2023-07-08 ENCOUNTER — Ambulatory Visit (HOSPITAL_COMMUNITY)
Admission: RE | Admit: 2023-07-08 | Discharge: 2023-07-08 | Disposition: A | Discharge status: Home / Self Care | Payer: Medicare Other | Source: Ambulatory Visit | Attending: Internal Medicine | Admitting: Internal Medicine

## 2023-07-08 DIAGNOSIS — F17200 Nicotine dependence, unspecified, uncomplicated: Secondary | ICD-10-CM | POA: Diagnosis not present

## 2023-07-08 DIAGNOSIS — I358 Other nonrheumatic aortic valve disorders: Secondary | ICD-10-CM | POA: Diagnosis not present

## 2023-07-08 DIAGNOSIS — I350 Nonrheumatic aortic (valve) stenosis: Secondary | ICD-10-CM | POA: Diagnosis not present

## 2023-07-08 DIAGNOSIS — I10 Essential (primary) hypertension: Secondary | ICD-10-CM | POA: Insufficient documentation

## 2023-07-08 DIAGNOSIS — E785 Hyperlipidemia, unspecified: Secondary | ICD-10-CM | POA: Insufficient documentation

## 2023-07-08 DIAGNOSIS — I2510 Atherosclerotic heart disease of native coronary artery without angina pectoris: Secondary | ICD-10-CM | POA: Insufficient documentation

## 2023-07-08 LAB — ECHOCARDIOGRAM COMPLETE
AR max vel: 2.09 cm2
AV Area VTI: 1.8 cm2
AV Area mean vel: 1.9 cm2
AV Mean grad: 3 mmHg
AV Peak grad: 4.8 mmHg
Ao pk vel: 1.1 m/s
Area-P 1/2: 3.17 cm2
S' Lateral: 3.4 cm

## 2023-07-08 NOTE — Progress Notes (Signed)
*  PRELIMINARY RESULTS* Echocardiogram 2D Echocardiogram has been performed.  David Rodriguez 07/08/2023, 3:23 PM

## 2023-07-08 NOTE — Progress Notes (Signed)
*  PRELIMINARY RESULTS* Echocardiogram 2D Echocardiogram has been performed.  Laddie Aquas 07/08/2023, 3:22 PM

## 2023-07-09 ENCOUNTER — Ambulatory Visit: Payer: Medicare Other | Admitting: General Surgery

## 2023-07-09 ENCOUNTER — Other Ambulatory Visit: Payer: Self-pay

## 2023-07-09 ENCOUNTER — Encounter: Payer: Self-pay | Admitting: General Surgery

## 2023-07-09 VITALS — BP 158/79 | HR 77 | Temp 97.8°F | Resp 18 | Ht 69.0 in | Wt 172.0 lb

## 2023-07-09 DIAGNOSIS — K4020 Bilateral inguinal hernia, without obstruction or gangrene, not specified as recurrent: Secondary | ICD-10-CM | POA: Diagnosis not present

## 2023-07-09 NOTE — Progress Notes (Unsigned)
[facility] History and Physical  Reason for Referral:*** Referring Physician: ***  Chief Complaint   Follow-up     David Rodriguez is a 77 y.o. male.  HPI: ***.  The *** started *** and has had a duration of ***.  It is associated with ***.  The *** is improved with ***, and is made worse with ***.    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   Hyperlipidemia    Screening for lung cancer 12/12/2022   Tobacco abuse 10/30/2016    Past Surgical History:  Procedure Laterality Date   ABDOMINAL AORTIC ANEURYSM REPAIR  2013   CARDIAC CATHETERIZATION  2017   TONSILLECTOMY AND ADENOIDECTOMY  1953    Family History  Problem Relation Age of Onset   Hypertension Mother    Cancer Father        MESOTHELIOMA   Cancer Sister        LUNG   Cancer Brother        LUNG    Social History   Tobacco Use   Smoking status: Every Day    Current packs/day: 0.00    Average packs/day: 0.5 packs/day for 50.0 years (25.0 ttl pk-yrs)    Types: Cigarettes    Start date: 08/27/1967    Last attempt to quit: 08/26/2017    Years since quitting: 5.8    Passive exposure: Never   Smokeless tobacco: Never   Tobacco comments:    2-3 cigarettes  a day   Vaping Use   Vaping status: Never Used  Substance Use Topics   Alcohol use: No   Drug use: No    Medications: {medication reviewed/display:3041432} Allergies as of 07/09/2023       Reactions   Iodine Hives        Medication List        Accurate as of July 09, 2023  9:09 AM. If you have any questions, ask your nurse or doctor.          albuterol 108 (90 Base) MCG/ACT inhaler Commonly known as: VENTOLIN HFA Inhale 2 puffs into the lungs every 6 (six) hours as needed for wheezing or shortness of breath.   Anoro Ellipta 62.5-25 MCG/ACT Aepb Generic drug: umeclidinium-vilanterol USE 1 INHALATION BY MOUTH DAILY  AT 6 AM   aspirin EC 81 MG  tablet Take 81 mg by mouth daily.   atorvastatin 40 MG tablet Commonly known as: LIPITOR TAKE 1 TABLET BY MOUTH DAILY   losartan 25 MG tablet Commonly known as: COZAAR TAKE 1 TABLET BY MOUTH DAILY   sennosides-docusate sodium 8.6-50 MG tablet Commonly known as: SENOKOT-S Take 1-2 tablets by mouth daily as needed for constipation.   Vitamin D-3 125 MCG (5000 UT) Tabs Take 1 tablet by mouth daily.         ROS:  {Review of Systems:30496}  Blood pressure (!) 158/79, pulse 77, temperature 97.8 F (36.6 C), temperature source Oral, resp. rate 18, height 5\' 9"  (1.753 m), weight 172 lb (78 kg), SpO2 92%. Physical Exam  Results: No results found for this or any previous visit (from the past 48 hour(s)).  ECHOCARDIOGRAM COMPLETE  Result Date: 07/08/2023    ECHOCARDIOGRAM REPORT   Patient Name:   David Rodriguez Date of Exam: 07/08/2023 Medical Rec #:  [id number]    Height:       69.0 in Accession #:    [id number]   Weight:  175.0 lb Date of Birth:  Feb 16, 1946    BSA:          1.952 m Patient Age:    23 years     BP:           140/74 mmHg Patient Gender: M            HR:           76 bpm. 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 CAD, EVAR, Aortic Valve Disease; Risk                 Factors:Hypertension, Current Smoker and Dyslipidemia.  Sonographer:    Dondra Prader RVT RCS Referring Phys: Anabel Halon  Sonographer Comments: Image acquisition challenging due to respiratory motion. IMPRESSIONS  1. Left ventricular ejection fraction, by estimation, is 45 to 50%. The left ventricle has mildly decreased function. The left ventricle demonstrates global hypokinesis. Left ventricular diastolic parameters are consistent with Grade I diastolic dysfunction (impaired relaxation).  2. Right  ventricular systolic function is normal. The right ventricular size is normal.  3. The mitral valve is normal in structure. No evidence of mitral valve regurgitation. No evidence of mitral stenosis.  4. The aortic valve is calcified. There is mild calcification of the aortic valve. Aortic valve regurgitation is not visualized. Aortic valve sclerosis is present, with no evidence of aortic valve stenosis. Aortic valve Vmax measures 1.10 m/s.  5. The inferior vena cava is normal in size with greater than 50% respiratory variability, suggesting right atrial pressure of 3 mmHg. FINDINGS  Left Ventricle: Left ventricular ejection fraction, by estimation, is 45 to 50%. The left ventricle has mildly decreased function. The left ventricle demonstrates global hypokinesis. The left ventricular internal cavity size was normal in size. There is  no left ventricular hypertrophy. Left ventricular diastolic parameters are consistent with Grade I diastolic dysfunction (impaired relaxation). Right Ventricle: The right ventricular size is normal. No increase in right ventricular wall thickness. Right ventricular systolic function is normal. Left Atrium: Left atrial size was normal in size. Right Atrium: Right atrial size was normal in size. Pericardium: There is no evidence of pericardial effusion. Mitral Valve: The mitral valve is normal in structure. Mild mitral annular calcification. No evidence of mitral valve regurgitation. No evidence of mitral valve stenosis. Tricuspid Valve: The tricuspid valve is normal in structure. Tricuspid valve regurgitation is not demonstrated. No evidence of tricuspid stenosis. Aortic Valve: The aortic valve is calcified. There is mild calcification of the aortic valve. Aortic valve regurgitation is not visualized. Aortic valve sclerosis is present, with no evidence of aortic valve stenosis. Aortic valve mean gradient measures 3.0 mmHg. Aortic valve peak gradient measures 4.8 mmHg. Aortic valve area,  by VTI measures 1.80 cm. Pulmonic Valve: The pulmonic valve was normal in structure. Pulmonic valve regurgitation is not visualized. No evidence of pulmonic stenosis. Aorta: The aortic root is normal in size and structure. Venous: The inferior vena cava is normal in size with greater than 50% respiratory variability, suggesting right atrial pressure of 3 mmHg. IAS/Shunts: No atrial level shunt detected by color flow Doppler.  LEFT VENTRICLE PLAX 2D LVIDd:         4.90 cm  Diastology LVIDs:         3.40 cm   LV e' medial:   6.22 cm/s LV PW:         1.10 cm   LV E/e' medial: 9.6 LV IVS:        1.30 cm LVOT diam:     2.00 cm LV SV:         42 LV SV Index:   22 LVOT Area:     3.14 cm  RIGHT VENTRICLE             IVC RV S prime:     13.80 cm/s  IVC diam: 1.80 cm TAPSE (M-mode): 2.4 cm LEFT ATRIUM             Index        RIGHT ATRIUM           Index LA diam:        3.20 cm 1.64 cm/m   RA Area:     12.40 cm LA Vol (A2C):   32.2 ml 16.50 ml/m  RA Volume:   31.00 ml  15.88 ml/m LA Vol (A4C):   31.5 ml 16.14 ml/m LA Biplane Vol: 32.5 ml 16.65 ml/m  AORTIC VALVE AV Area (Vmax):    2.09 cm AV Area (Vmean):   1.90 cm AV Area (VTI):     1.80 cm AV Vmax:           110.00 cm/s AV Vmean:          76.100 cm/s AV VTI:            0.234 m AV Peak Grad:      4.8 mmHg AV Mean Grad:      3.0 mmHg LVOT Vmax:         73.20 cm/s LVOT Vmean:        46.000 cm/s LVOT VTI:          0.134 m LVOT/AV VTI ratio: 0.57  AORTA Ao Root diam: 3.40 cm MITRAL VALVE MV Area (PHT): 3.17 cm    SHUNTS MV Decel Time: 239 msec    Systemic VTI:  0.13 m MV E velocity: 59.90 cm/s  Systemic Diam: 2.00 cm MV A velocity: 78.60 cm/s MV E/A ratio:  0.76 David Schultz MD Electronically signed by David Schultz MD Signature Date/Time: 07/08/2023/4:38:50 PM    Final      Assessment & Plan:  David Rodriguez is a 77 y.o. male with *** -*** -*** -Follow up ***  All questions were answered to the satisfaction of the patient and family***.  The risk and benefits  of *** were discussed including but not limited to ***.  After careful consideration, David Rodriguez has decided to ***.    Lucretia Roers 07/09/2023, 9:09 AM

## 2023-07-16 ENCOUNTER — Other Ambulatory Visit (HOSPITAL_COMMUNITY): Payer: Medicare Other

## 2023-08-19 NOTE — Patient Instructions (Signed)
David Rodriguez  08/19/2023     @PREFPERIOPPHARMACY @   Your procedure is scheduled on  08/22/2023.   Report to [facility] at  0600 A.M.   Call this number if you have problems the morning of surgery:  [phone]  If you experience any cold or flu symptoms such as cough, fever, chills, shortness of breath, etc. between now and your scheduled surgery, please notify us at the above number.   Remember:      Use your inhalers before you come and bring your rescue inhaler with you.    Do not eat after midnight.   You may drink clear liquids until 0330 am on 08/22/2023.     Clear liquids allowed are:                    Water, Carbonated beverages (diabetics please choose diet or no sugar options), Black Coffee Only (No creamer, milk or cream, including half & half and powdered creamer), and Clear Sports drink (No red color; diabetics please choose diet or no sugar options)    Take these medicines the morning of surgery with A SIP OF WATER                                                    None.    Do not wear jewelry, make-up or nail polish, including gel polish,  artificial nails, or any other type of covering on natural nails (fingers and  toes).  Do not wear lotions, powders, or perfumes, or deodorant.  Do not shave 48 hours prior to surgery.  Men may shave face and neck.  Do not bring valuables to the hospital.  [facility] is not responsible for any belongings or valuables.  Contacts, dentures or bridgework may not be worn into surgery.  Leave your suitcase in the car.  After surgery it may be brought to your room.  For patients admitted to the hospital, discharge time will be determined by your treatment team.  Patients discharged the day of surgery will not be allowed to drive home and must have someone with them for 24 hours.    Special instructions:   DO NOT smoke tobacco or vape for 24 hours before your procedure.  Please read over the following fact  sheets that you were given. Coughing and Deep Breathing, Surgical Site Infection Prevention, Anesthesia Post-op Instructions, and Care and Recovery After Surgery      Laparoscopic Inguinal Hernia Repair, Adult, Care After The following information offers guidance on how to care for yourself after your procedure. Your health care provider may also give you more specific instructions. If you have problems or questions, contact your health care provider. What can I expect after the procedure? After the procedure, it is common to have: Pain. Swelling and bruising around the incision area. Scrotal swelling, in males. Some fluid or blood draining from your incisions. Follow these instructions at home: Medicines Take over-the-counter and prescription medicines only as told by your health care provider. Ask your health care provider if the medicine prescribed to you: Requires you to avoid driving or using machinery. Can cause constipation. You may need to take these actions to prevent or treat constipation: Drink enough fluid to keep your urine pale yellow. Take over-the-counter or prescription medicines. Eat  foods that are high in fiber, such as beans, whole grains, and fresh fruits and vegetables. Limit foods that are high in fat and processed sugars, such as fried or sweet foods. Incision care  Follow instructions from your health care provider about how to take care of your incisions. Make sure you: Wash your hands with soap and water for at least 20 seconds before and after you change your bandage (dressing). If soap and water are not available, use hand sanitizer. Change your dressing as told by your health care provider. Leave stitches (sutures), skin glue, or adhesive strips in place. These skin closures may need to stay in place for 2 weeks or longer. If adhesive strip edges start to loosen and curl up, you may trim the loose edges. Do not remove adhesive strips completely unless your  health care provider tells you to do that. Check your incision area every day for signs of infection. Check for: More redness, swelling, or pain. More fluid or blood. Warmth. Pus or a bad smell. Wear loose, soft clothing while your incisions heal. Managing pain and swelling If directed, put ice on the painful or swollen areas. To do this: Put ice in a plastic bag. Place a towel between your skin and the bag. Leave the ice on for 20 minutes, 2-3 times a day. Remove the ice if your skin turns bright red. This is very important. If you cannot feel pain, heat, or cold, you have a greater risk of damage to the area.  Activity Do not lift anything that is heavier than 10 lb (4.5 kg), or the limit that you are told, until your health care provider says that it is safe. Ask your health care provider what activities are safe for you. A lot of activity during the first week after surgery can increase pain and swelling. For 1 week after your procedure: Avoid activities that take a lot of effort, such as exercise or sports. You may walk and climb stairs as needed for daily activity, but avoid long walks or climbing stairs for exercise. General instructions If you were given a sedative during the procedure, it can affect you for several hours. Do not drive or operate machinery until your health care provider says that it is safe. Do not take baths, swim, or use a hot tub until your health care provider approves. Ask your health care provider if you may take showers. You may only be allowed to take sponge baths. Do not use any products that contain nicotine or tobacco. These products include cigarettes, chewing tobacco, and vaping devices, such as e-cigarettes. If you need help quitting, ask your health care provider. Keep all follow-up visits. This is important. Contact a health care provider if: You have any of these signs of infection: More redness, swelling, or pain around your incisions or your  groin area. More fluid or blood coming from an incision. Warmth coming from an incision. Pus or a bad smell coming from an incision. A fever or chills. You have more swelling in your scrotum, if you are male. You have severe pain and medicines do not help. You have abdominal pain or swelling. You cannot urinate or have a bowel movement. You faint or feel dizzy. You have nausea and vomiting. Get help right away if: You have redness, warmth, or pain in your leg. You have chest pain. You have problems breathing. These symptoms may represent a serious problem that is an emergency. Do not wait to see if the  symptoms will go away. Get medical help right away. Call your local emergency services (911 in the [place] Do not drive yourself to the hospital. Summary Pain, swelling, and bruising are common after the procedure. Check your incision area every day for signs of infection, such as more redness, swelling, or pain. Put ice on painful or swollen areas for 20 minutes, 2-3 times a day. This information is not intended to replace advice given to you by your health care provider. Make sure you discuss any questions you have with your health care provider. Document Revised: 05/31/2020 Document Reviewed: 05/31/2020 Elsevier Patient Education  2024 [employer] General Anesthesia, Adult, Care After The following information offers guidance on how to care for yourself after your procedure. Your health care provider may also give you more specific instructions. If you have problems or questions, contact your health care provider. What can I expect after the procedure? After the procedure, it is common for people to: Have pain or discomfort at the IV site. Have nausea or vomiting. Have a sore throat or hoarseness. Have trouble concentrating. Feel cold or chills. Feel weak, sleepy, or tired (fatigue). Have soreness and body aches. These can affect parts of the body that were not involved in  surgery. Follow these instructions at home: For the time period you were told by your health care provider:  Rest. Do not participate in activities where you could fall or become injured. Do not drive or use machinery. Do not drink alcohol. Do not take sleeping pills or medicines that cause drowsiness. Do not make important decisions or sign legal documents. Do not take care of children on your own. General instructions Drink enough fluid to keep your urine pale yellow. If you have sleep apnea, surgery and certain medicines can increase your risk for breathing problems. Follow instructions from your health care provider about wearing your sleep device: Anytime you are sleeping, including during daytime naps. While taking prescription pain medicines, sleeping medicines, or medicines that make you drowsy. Return to your normal activities as told by your health care provider. Ask your health care provider what activities are safe for you. Take over-the-counter and prescription medicines only as told by your health care provider. Do not use any products that contain nicotine or tobacco. These products include cigarettes, chewing tobacco, and vaping devices, such as e-cigarettes. These can delay incision healing after surgery. If you need help quitting, ask your health care provider. Contact a health care provider if: You have nausea or vomiting that does not get better with medicine. You vomit every time you eat or drink. You have pain that does not get better with medicine. You cannot urinate or have bloody urine. You develop a skin rash. You have a fever. Get help right away if: You have trouble breathing. You have chest pain. You vomit blood. These symptoms may be an emergency. Get help right away. Call 911. Do not wait to see if the symptoms will go away. Do not drive yourself to the hospital. Summary After the procedure, it is common to have a sore throat, hoarseness, nausea,  vomiting, or to feel weak, sleepy, or fatigue. For the time period you were told by your health care provider, do not drive or use machinery. Get help right away if you have difficulty breathing, have chest pain, or vomit blood. These symptoms may be an emergency. This information is not intended to replace advice given to you by your health care provider. Make sure you discuss any  questions you have with your health care provider. Document Revised: 12/29/2021 Document Reviewed: 12/29/2021 Elsevier Patient Education  2024 [employer] How to Use Chlorhexidine Before Surgery Chlorhexidine gluconate (CHG) is a germ-killing (antiseptic) solution that is used to clean the skin. It can get rid of the bacteria that normally live on the skin and can keep them away for about 24 hours. To clean your skin with CHG, you may be given: A CHG solution to use in the shower or as part of a sponge bath. A prepackaged cloth that contains CHG. Cleaning your skin with CHG may help lower the risk for infection: While you are staying in the intensive care unit of the hospital. If you have a vascular access, such as a central line, to provide short-term or long-term access to your veins. If you have a catheter to drain urine from your bladder. If you are on a ventilator. A ventilator is a machine that helps you breathe by moving air in and out of your lungs. After surgery. What are the risks? Risks of using CHG include: A skin reaction. Hearing loss, if CHG gets in your ears and you have a perforated eardrum. Eye injury, if CHG gets in your eyes and is not rinsed out. The CHG product catching fire. Make sure that you avoid smoking and flames after applying CHG to your skin. Do not use CHG: If you have a chlorhexidine allergy or have previously reacted to chlorhexidine. On babies younger than 33 months of age. How to use CHG solution Use CHG only as told by your health care provider, and follow the instructions  on the label. Use the full amount of CHG as directed. Usually, this is one bottle. During a shower Follow these steps when using CHG solution during a shower (unless your health care provider gives you different instructions): Start the shower. Use your normal soap and shampoo to wash your face and hair. Turn off the shower or move out of the shower stream. Pour the CHG onto a clean washcloth. Do not use any type of brush or rough-edged sponge. Starting at your neck, lather your body down to your toes. Make sure you follow these instructions: If you will be having surgery, pay special attention to the part of your body where you will be having surgery. Scrub this area for at least 1 minute. Do not use CHG on your head or face. If the solution gets into your ears or eyes, rinse them well with water. Avoid your genital area. Avoid any areas of skin that have broken skin, cuts, or scrapes. Scrub your back and under your arms. Make sure to wash skin folds. Let the lather sit on your skin for 1-2 minutes or as long as told by your health care provider. Thoroughly rinse your entire body in the shower. Make sure that all body creases and crevices are rinsed well. Dry off with a clean towel. Do not put any substances on your body afterward--such as powder, lotion, or perfume--unless you are told to do so by your health care provider. Only use lotions that are recommended by the manufacturer. Put on clean clothes or pajamas. If it is the night before your surgery, sleep in clean sheets.  During a sponge bath Follow these steps when using CHG solution during a sponge bath (unless your health care provider gives you different instructions): Use your normal soap and shampoo to wash your face and hair. Pour the CHG onto a clean washcloth. Starting at  your neck, lather your body down to your toes. Make sure you follow these instructions: If you will be having surgery, pay special attention to the part of  your body where you will be having surgery. Scrub this area for at least 1 minute. Do not use CHG on your head or face. If the solution gets into your ears or eyes, rinse them well with water. Avoid your genital area. Avoid any areas of skin that have broken skin, cuts, or scrapes. Scrub your back and under your arms. Make sure to wash skin folds. Let the lather sit on your skin for 1-2 minutes or as long as told by your health care provider. Using a different clean, wet washcloth, thoroughly rinse your entire body. Make sure that all body creases and crevices are rinsed well. Dry off with a clean towel. Do not put any substances on your body afterward--such as powder, lotion, or perfume--unless you are told to do so by your health care provider. Only use lotions that are recommended by the manufacturer. Put on clean clothes or pajamas. If it is the night before your surgery, sleep in clean sheets. How to use CHG prepackaged cloths Only use CHG cloths as told by your health care provider, and follow the instructions on the label. Use the CHG cloth on clean, dry skin. Do not use the CHG cloth on your head or face unless your health care provider tells you to. When washing with the CHG cloth: Avoid your genital area. Avoid any areas of skin that have broken skin, cuts, or scrapes. Before surgery Follow these steps when using a CHG cloth to clean before surgery (unless your health care provider gives you different instructions): Using the CHG cloth, vigorously scrub the part of your body where you will be having surgery. Scrub using a back-and-forth motion for 3 minutes. The area on your body should be completely wet with CHG when you are done scrubbing. Do not rinse. Discard the cloth and let the area air-dry. Do not put any substances on the area afterward, such as powder, lotion, or perfume. Put on clean clothes or pajamas. If it is the night before your surgery, sleep in clean sheets.  For  general bathing Follow these steps when using CHG cloths for general bathing (unless your health care provider gives you different instructions). Use a separate CHG cloth for each area of your body. Make sure you wash between any folds of skin and between your fingers and toes. Wash your body in the following order, switching to a new cloth after each step: The front of your neck, shoulders, and chest. Both of your arms, under your arms, and your hands. Your stomach and groin area, avoiding the genitals. Your right leg and foot. Your left leg and foot. The back of your neck, your back, and your buttocks. Do not rinse. Discard the cloth and let the area air-dry. Do not put any substances on your body afterward--such as powder, lotion, or perfume--unless you are told to do so by your health care provider. Only use lotions that are recommended by the manufacturer. Put on clean clothes or pajamas. Contact a health care provider if: Your skin gets irritated after scrubbing. You have questions about using your solution or cloth. You swallow any chlorhexidine. Call your local poison control center (706-416-6420 in the [place] Get help right away if: Your eyes itch badly, or they become very red or swollen. Your skin itches badly and is red or swollen.  Your hearing changes. You have trouble seeing. You have swelling or tingling in your mouth or throat. You have trouble breathing. These symptoms may represent a serious problem that is an emergency. Do not wait to see if the symptoms will go away. Get medical help right away. Call your local emergency services (911 in the [place] Do not drive yourself to the hospital. Summary Chlorhexidine gluconate (CHG) is a germ-killing (antiseptic) solution that is used to clean the skin. Cleaning your skin with CHG may help to lower your risk for infection. You may be given CHG to use for bathing. It may be in a bottle or in a prepackaged cloth to use on your  skin. Carefully follow your health care provider's instructions and the instructions on the product label. Do not use CHG if you have a chlorhexidine allergy. Contact your health care provider if your skin gets irritated after scrubbing. This information is not intended to replace advice given to you by your health care provider. Make sure you discuss any questions you have with your health care provider. Document Revised: 01/29/2022 Document Reviewed: 12/12/2020 Elsevier Patient Education  2023 [employer].

## 2023-08-20 ENCOUNTER — Encounter (HOSPITAL_COMMUNITY)
Admission: RE | Admit: 2023-08-20 | Discharge: 2023-08-20 | Disposition: A | Discharge status: Home / Self Care | Payer: Medicare Other | Source: Ambulatory Visit | Attending: General Surgery | Admitting: General Surgery

## 2023-08-20 ENCOUNTER — Encounter (HOSPITAL_COMMUNITY): Payer: Self-pay

## 2023-08-20 VITALS — BP 151/73 | HR 74 | Temp 97.7°F | Resp 18 | Ht 69.0 in | Wt 172.0 lb

## 2023-08-20 DIAGNOSIS — Z720 Tobacco use: Secondary | ICD-10-CM | POA: Insufficient documentation

## 2023-08-20 DIAGNOSIS — Z01810 Encounter for preprocedural cardiovascular examination: Secondary | ICD-10-CM | POA: Insufficient documentation

## 2023-08-20 DIAGNOSIS — I10 Essential (primary) hypertension: Secondary | ICD-10-CM | POA: Insufficient documentation

## 2023-08-20 HISTORY — DX: Abdominal aortic aneurysm, without rupture, unspecified: I71.40

## 2023-08-20 HISTORY — DX: Essential (primary) hypertension: I10

## 2023-08-22 ENCOUNTER — Ambulatory Visit (HOSPITAL_COMMUNITY)
Admission: RE | Admit: 2023-08-22 | Discharge: 2023-08-22 | Disposition: A | Discharge status: Home / Self Care | Payer: Medicare Other | Attending: General Surgery | Admitting: General Surgery

## 2023-08-22 ENCOUNTER — Ambulatory Visit (HOSPITAL_BASED_OUTPATIENT_CLINIC_OR_DEPARTMENT_OTHER): Payer: Medicare Other | Admitting: Anesthesiology

## 2023-08-22 ENCOUNTER — Encounter (HOSPITAL_COMMUNITY)
Admission: RE | Disposition: A | Discharge status: Home / Self Care | Payer: Self-pay | Source: Home / Self Care | Attending: General Surgery

## 2023-08-22 ENCOUNTER — Ambulatory Visit (HOSPITAL_COMMUNITY): Payer: Medicare Other | Admitting: Anesthesiology

## 2023-08-22 ENCOUNTER — Encounter (HOSPITAL_COMMUNITY): Payer: Self-pay | Admitting: General Surgery

## 2023-08-22 DIAGNOSIS — J449 Chronic obstructive pulmonary disease, unspecified: Secondary | ICD-10-CM | POA: Insufficient documentation

## 2023-08-22 DIAGNOSIS — K4020 Bilateral inguinal hernia, without obstruction or gangrene, not specified as recurrent: Secondary | ICD-10-CM | POA: Diagnosis not present

## 2023-08-22 DIAGNOSIS — F17210 Nicotine dependence, cigarettes, uncomplicated: Secondary | ICD-10-CM | POA: Diagnosis not present

## 2023-08-22 DIAGNOSIS — I2510 Atherosclerotic heart disease of native coronary artery without angina pectoris: Secondary | ICD-10-CM | POA: Diagnosis not present

## 2023-08-22 DIAGNOSIS — I10 Essential (primary) hypertension: Secondary | ICD-10-CM | POA: Insufficient documentation

## 2023-08-22 DIAGNOSIS — F17200 Nicotine dependence, unspecified, uncomplicated: Secondary | ICD-10-CM | POA: Diagnosis not present

## 2023-08-22 SURGERY — REPAIR, HERNIA, INGUINAL, BILATERAL, ROBOT-ASSISTED
Anesthesia: General | Site: Abdomen | Laterality: Bilateral

## 2023-08-22 MED ORDER — SUGAMMADEX SODIUM 500 MG/5ML IV SOLN
INTRAVENOUS | Status: DC | PRN
  Administered 2023-08-22: 400 mg via INTRAVENOUS

## 2023-08-22 MED ORDER — DEXAMETHASONE SODIUM PHOSPHATE 10 MG/ML IJ SOLN
INTRAMUSCULAR | Status: DC | PRN
  Administered 2023-08-22: 10 mg via INTRAVENOUS

## 2023-08-22 MED ORDER — LIDOCAINE HCL (PF) 2 % IJ SOLN
INTRAMUSCULAR | Status: AC
  Filled 2023-08-22: qty 5

## 2023-08-22 MED ORDER — HYDROMORPHONE HCL 1 MG/ML IJ SOLN
INTRAMUSCULAR | Status: AC
  Filled 2023-08-22: qty 0.5

## 2023-08-22 MED ORDER — FENTANYL CITRATE (PF) 100 MCG/2ML IJ SOLN
INTRAMUSCULAR | Status: DC | PRN
  Administered 2023-08-22: 100 ug via INTRAVENOUS
  Administered 2023-08-22 (×3): 50 ug via INTRAVENOUS

## 2023-08-22 MED ORDER — CHLORHEXIDINE GLUCONATE CLOTH 2 % EX PADS: 6.0000 | MEDICATED_PAD | Freq: Once | CUTANEOUS | Status: DC

## 2023-08-22 MED ORDER — PROPOFOL 10 MG/ML IV BOLUS
INTRAVENOUS | Status: AC
  Filled 2023-08-22: qty 20

## 2023-08-22 MED ORDER — FENTANYL CITRATE (PF) 250 MCG/5ML IJ SOLN
INTRAMUSCULAR | Status: AC
  Filled 2023-08-22: qty 5

## 2023-08-22 MED ORDER — BUPIVACAINE HCL (PF) 0.5 % IJ SOLN
INTRAMUSCULAR | Status: AC
  Filled 2023-08-22: qty 30

## 2023-08-22 MED ORDER — OXYCODONE HCL 5 MG PO TABS: 5.0000 mg | ORAL_TABLET | Freq: Once | ORAL | Status: DC | PRN

## 2023-08-22 MED ORDER — ORAL CARE MOUTH RINSE: 15.0000 mL | Freq: Once | OROMUCOSAL | Status: DC

## 2023-08-22 MED ORDER — MIDAZOLAM HCL 5 MG/5ML IJ SOLN
INTRAMUSCULAR | Status: DC | PRN
  Administered 2023-08-22 (×2): 1 mg via INTRAVENOUS

## 2023-08-22 MED ORDER — CHLORHEXIDINE GLUCONATE 0.12 % MT SOLN
15.0000 mL | Freq: Once | OROMUCOSAL | Status: AC
  Administered 2023-08-22: 15 mL via OROMUCOSAL

## 2023-08-22 MED ORDER — ONDANSETRON HCL 4 MG/2ML IJ SOLN
INTRAMUSCULAR | Status: DC | PRN
  Administered 2023-08-22: 4 mg via INTRAVENOUS

## 2023-08-22 MED ORDER — HYDROMORPHONE HCL 1 MG/ML IJ SOLN
INTRAMUSCULAR | Status: AC
  Filled 2023-08-22: qty 1

## 2023-08-22 MED ORDER — ONDANSETRON HCL 4 MG/2ML IJ SOLN: 4.0000 mg | Freq: Once | INTRAMUSCULAR | Status: DC | PRN

## 2023-08-22 MED ORDER — PROPOFOL 10 MG/ML IV BOLUS
INTRAVENOUS | Status: DC | PRN
  Administered 2023-08-22: 150 mg via INTRAVENOUS

## 2023-08-22 MED ORDER — ORAL CARE MOUTH RINSE: 15.0000 mL | Freq: Once | OROMUCOSAL | Status: AC

## 2023-08-22 MED ORDER — ONDANSETRON HCL 4 MG PO TABS: 4.0000 mg | ORAL_TABLET | Freq: Three times a day (TID) | ORAL | 1 refills | Status: DC | PRN

## 2023-08-22 MED ORDER — PHENYLEPHRINE HCL (PRESSORS) 10 MG/ML IV SOLN
INTRAVENOUS | Status: DC | PRN
  Administered 2023-08-22 (×5): 160 ug via INTRAVENOUS

## 2023-08-22 MED ORDER — CEFAZOLIN SODIUM-DEXTROSE 2-4 GM/100ML-% IV SOLN
2.0000 g | INTRAVENOUS | Status: AC
  Administered 2023-08-22: 2 g via INTRAVENOUS
  Filled 2023-08-22: qty 100

## 2023-08-22 MED ORDER — BUPIVACAINE HCL (PF) 0.5 % IJ SOLN
INTRAMUSCULAR | Status: DC | PRN
  Administered 2023-08-22: 30 mL

## 2023-08-22 MED ORDER — ONDANSETRON HCL 4 MG/2ML IJ SOLN
INTRAMUSCULAR | Status: AC
  Filled 2023-08-22: qty 2

## 2023-08-22 MED ORDER — LIDOCAINE HCL (CARDIAC) PF 100 MG/5ML IV SOSY
PREFILLED_SYRINGE | INTRAVENOUS | Status: DC | PRN
  Administered 2023-08-22: 50 mg via INTRAVENOUS

## 2023-08-22 MED ORDER — OXYCODONE HCL 5 MG PO TABS: 5.0000 mg | ORAL_TABLET | ORAL | 0 refills | Status: DC | PRN

## 2023-08-22 MED ORDER — DEXAMETHASONE SODIUM PHOSPHATE 10 MG/ML IJ SOLN
INTRAMUSCULAR | Status: AC
  Filled 2023-08-22: qty 1

## 2023-08-22 MED ORDER — LACTATED RINGERS IV SOLN: INTRAVENOUS | Status: DC

## 2023-08-22 MED ORDER — CHLORHEXIDINE GLUCONATE 0.12 % MT SOLN: 15.0000 mL | Freq: Once | OROMUCOSAL | Status: DC

## 2023-08-22 MED ORDER — PHENYLEPHRINE 80 MCG/ML (10ML) SYRINGE FOR IV PUSH (FOR BLOOD PRESSURE SUPPORT)
PREFILLED_SYRINGE | INTRAVENOUS | Status: AC
  Filled 2023-08-22: qty 10

## 2023-08-22 MED ORDER — MIDAZOLAM HCL 2 MG/2ML IJ SOLN
INTRAMUSCULAR | Status: AC
  Filled 2023-08-22: qty 2

## 2023-08-22 MED ORDER — ROCURONIUM BROMIDE 10 MG/ML (PF) SYRINGE
PREFILLED_SYRINGE | INTRAVENOUS | Status: AC
  Filled 2023-08-22: qty 10

## 2023-08-22 MED ORDER — OXYCODONE HCL 5 MG/5ML PO SOLN: 5.0000 mg | Freq: Once | ORAL | Status: DC | PRN

## 2023-08-22 MED ORDER — ROCURONIUM BROMIDE 100 MG/10ML IV SOLN
INTRAVENOUS | Status: DC | PRN
  Administered 2023-08-22: 70 mg via INTRAVENOUS
  Administered 2023-08-22: 30 mg via INTRAVENOUS

## 2023-08-22 MED ORDER — STERILE WATER FOR IRRIGATION IR SOLN
Status: DC | PRN
  Administered 2023-08-22: 500 mL

## 2023-08-22 MED ORDER — HYDROMORPHONE HCL 1 MG/ML IJ SOLN
0.2500 mg | INTRAMUSCULAR | Status: DC | PRN
  Administered 2023-08-22 (×3): 0.5 mg via INTRAVENOUS

## 2023-08-22 SURGICAL SUPPLY — 61 items
ADH SKN CLS APL DERMABOND .7 (GAUZE/BANDAGES/DRESSINGS) ×1
APL PRP STRL LF DISP 70% ISPRP (MISCELLANEOUS) ×1
BLADE SURG 15 STRL LF DISP TIS (BLADE) IMPLANT
BLADE SURG 15 STRL SS (BLADE) ×1
CHLORAPREP W/TINT 26 (MISCELLANEOUS) ×1 IMPLANT
COVER LIGHT HANDLE STERIS (MISCELLANEOUS) ×1 IMPLANT
COVER MAYO STAND XLG (MISCELLANEOUS) ×1 IMPLANT
COVER TIP SHEARS 8 DVNC (MISCELLANEOUS) ×1 IMPLANT
DEFOGGER SCOPE WARMER CLEARIFY (MISCELLANEOUS) IMPLANT
DERMABOND ADVANCED .7 DNX12 (GAUZE/BANDAGES/DRESSINGS) ×1 IMPLANT
DRAPE 3/4 80X56 (DRAPES) ×1 IMPLANT
DRAPE ARM DVNC X/XI (DISPOSABLE) ×3 IMPLANT
DRAPE COLUMN DVNC XI (DISPOSABLE) ×1 IMPLANT
DRAPE HALF SHEET 40X57 (DRAPES) ×1 IMPLANT
DRIVER NDL MEGA SUTCUT DVNCXI (INSTRUMENTS) ×1 IMPLANT
DRIVER NDLE MEGA SUTCUT DVNCXI (INSTRUMENTS) ×1 IMPLANT
ELECT REM PT RETURN 9FT ADLT (ELECTROSURGICAL) ×1
ELECTRODE REM PT RTRN 9FT ADLT (ELECTROSURGICAL) ×1 IMPLANT
FORCEPS BPLR R/ABLATION 8 DVNC (INSTRUMENTS) ×1 IMPLANT
GAUZE SPONGE 4X4 12PLY STRL (GAUZE/BANDAGES/DRESSINGS) ×1 IMPLANT
GLOVE BIO SURGEON STRL SZ 6.5 (GLOVE) ×2 IMPLANT
GLOVE BIO SURGEON STRL SZ7 (GLOVE) IMPLANT
GLOVE BIOGEL PI IND STRL 6.5 (GLOVE) ×2 IMPLANT
GLOVE BIOGEL PI IND STRL 7.0 (GLOVE) ×3 IMPLANT
GLOVE BIOGEL PI IND STRL 7.5 (GLOVE) IMPLANT
GLOVE ECLIPSE 6.5 STRL STRAW (GLOVE) IMPLANT
GOWN STRL REUS W/TWL LRG LVL3 (GOWN DISPOSABLE) ×2 IMPLANT
GRASPER SUT TROCAR 14GX15 (MISCELLANEOUS) IMPLANT
IRRIGATOR SUCT 8 DISP DVNC XI (IRRIGATION / IRRIGATOR) IMPLANT
KIT PINK PAD W/HEAD ARE REST (MISCELLANEOUS) ×1
KIT PINK PAD W/HEAD ARM REST (MISCELLANEOUS) ×1 IMPLANT
KIT TURNOVER KIT A (KITS) ×1 IMPLANT
MANIFOLD NEPTUNE II (INSTRUMENTS) ×1 IMPLANT
MESH 3DMAX MID 5X7 LT XLRG (Mesh General) ×1 IMPLANT
MESH 3DMAX MID 5X7 RT XLRG (Mesh General) ×1 IMPLANT
NDL HYPO 21X1.5 SAFETY (NEEDLE) IMPLANT
NDL INSUFFLATION 14GA 120MM (NEEDLE) ×1 IMPLANT
NEEDLE HYPO 21X1.5 SAFETY (NEEDLE) ×1 IMPLANT
NEEDLE INSUFFLATION 14GA 120MM (NEEDLE) ×1 IMPLANT
OBTURATOR OPTICAL STND 8 DVNC (TROCAR) ×1
OBTURATOR OPTICALSTD 8 DVNC (TROCAR) ×1 IMPLANT
PACK LAP CHOLECYSTECTOMY (MISCELLANEOUS) ×1 IMPLANT
PENCIL HANDSWITCHING (ELECTRODE) ×1 IMPLANT
SCISSORS MNPLR CVD DVNC XI (INSTRUMENTS) ×1 IMPLANT
SEAL UNIV 5-12 XI (MISCELLANEOUS) ×3 IMPLANT
SET TUBE DA VINCI INSUFFLATOR (TUBING) IMPLANT
SOL PREP POV-IOD 4OZ 10% (MISCELLANEOUS) ×1 IMPLANT
SUT MNCRL AB 4-0 PS2 18 (SUTURE) ×1 IMPLANT
SUT STRATAFIX 0 PDS+ CT-2 23 (SUTURE) ×2
SUT V-LOC 90 ABS 3-0 VLT V-20 (SUTURE) ×2 IMPLANT
SUT VIC AB 2-0 SH 27 (SUTURE) ×2
SUT VIC AB 2-0 SH 27X BRD (SUTURE) ×1 IMPLANT
SUT VIC AB 3-0 SH 27 (SUTURE) ×1
SUT VIC AB 3-0 SH 27X BRD (SUTURE) ×1 IMPLANT
SUT VICRYL 0 UR6 27IN ABS (SUTURE) IMPLANT
SUTURE STRATFX 0 PDS+ CT-2 23 (SUTURE) IMPLANT
SYR 30ML LL (SYRINGE) ×2 IMPLANT
TAPE TRANSPORE STRL 2 31045 (GAUZE/BANDAGES/DRESSINGS) ×1 IMPLANT
TRAY FOL W/BAG SLVR 16FR STRL (SET/KITS/TRAYS/PACK) ×1 IMPLANT
TRAY FOLEY W/BAG SLVR 16FR LF (SET/KITS/TRAYS/PACK) ×1
WATER STERILE IRR 500ML POUR (IV SOLUTION) ×1 IMPLANT

## 2023-08-22 NOTE — Anesthesia Preprocedure Evaluation (Signed)
Anesthesia Evaluation  Patient identified by MRN, date of birth, ID band Patient awake    Reviewed: Allergy & Precautions, H&P , NPO status , Patient's Chart, lab work & pertinent test results, reviewed documented beta blocker date and time   Airway Mallampati: II  TM Distance: >3 FB Neck ROM: full    Dental no notable dental hx.    Pulmonary neg pulmonary ROS, COPD, Current Smoker   Pulmonary exam normal breath sounds clear to auscultation       Cardiovascular Exercise Tolerance: Good hypertension, + CAD  negative cardio ROS  Rhythm:regular Rate:Normal     Neuro/Psych  PSYCHIATRIC DISORDERS      negative neurological ROS  negative psych ROS   GI/Hepatic negative GI ROS, Neg liver ROS,,,  Endo/Other  negative endocrine ROS    Renal/GU negative Renal ROS  negative genitourinary   Musculoskeletal   Abdominal   Peds  Hematology negative hematology ROS (+)   Anesthesia Other Findings   Reproductive/Obstetrics negative OB ROS                             Anesthesia Physical Anesthesia Plan  ASA: 3  Anesthesia Plan: General and General ETT   Post-op Pain Management:    Induction:   PONV Risk Score and Plan: Ondansetron  Airway Management Planned:   Additional Equipment:   Intra-op Plan:   Post-operative Plan:   Informed Consent: I have reviewed the patients History and Physical, chart, labs and discussed the procedure including the risks, benefits and alternatives for the proposed anesthesia with the patient or authorized representative who has indicated his/her understanding and acceptance.     Dental Advisory Given  Plan Discussed with: CRNA  Anesthesia Plan Comments:        Anesthesia Quick Evaluation

## 2023-08-22 NOTE — Op Note (Signed)
[facility] Operative Note  08/22/23  Preoperative Diagnosis: Bilateral inguinal Hernia   Postoperative Diagnosis: Same   Procedure(s) Performed: Robotic assisted laparoscopic bilateral inguinal hernia repair with mesh (XL Bard 3DMax Medium weight mesh X2)   Surgeon: Leatrice Jewels. Henreitta Leber, MD   Assistants: No qualified resident was available    Anesthesia: General endotracheal   Anesthesiologist: Windell Norfolk, MD    Specimens: None   Estimated Blood Loss: Minimal   Blood Replacement: None    Complications: None   Wound Class:Clean   Operative Indications: The patient has a bilateral inguinal hernia that is symptomatic and they want repaired. We discussed robotic assisted laparoscopic inguinal hernia repair and risk of bleeding, infection, issues with chronic pain post operatively, use of mesh, risk of recurrence, chance of needing to repair a bilateral hernia, risk of injury to bowel or bladder, and risk of injury to cord structures for male patients.   Findings: Vas Deferens and cord structures identified and preserved Direct and indirect defects bilaterally  Bard 3D Max Medium Weight Mesh, XL size Hemostasis achieved   Procedure: The patient was taken to the operating room and placed supine. General endotracheal anesthesia was induced. Intravenous antibiotics were  administered per protocol.  A foley catheter was placed and a orogastric tube positioned to decompress the stomach. The abdomen was prepared and draped in the usual sterile fashion.   An incision was marked 20 cm above the pubic tubercle, slightly above the umbilicus. Veress needle inserted at palmer's point.  Saline drop test noted to be positive with gradual increase in pressure after initiation of gas insufflation.  15 mm of pressure was achieved prior to removing the Veress needle and then placing a 8 mm port via the Optiview technique through the supraumbilical site that had been previously  marked.  Inspection of the area afterwards noted no injury to the surrounding organs during insertion of the needle and the port.  2 port sites were marked 8 cm to the lateral sides of the initial port, and a 8 mm robotic port was placed on the left side, another 8 mm robotic port on the right side under direct supervision. The [employer] platform was then brought into the operative field and docked to the ports.  Examination of the abdominal cavity noted a bilateral inguinal hernia.  On the left, a peritoneal flap was created approximately 5cm cephalad to the defect by using scissors with electrocautery.  Dissection was carried down towards the pubic tubercle, developing the myopectineal orifice view. Laterally the flap was carried towards the ASIS.  A direct and indirect hernia sac was noted, which carefully dissected away from the adjacent tissues to be fully reduced out of hernia cavity.  Any bleeding was controlled with combination of electrocautery and manual pressure.    After confirming adequate dissection and the peritoneal reflection completely down and away from the cord structures.  The XL Bard mesh, 2-0 Vicryl, 3-0 V-lock and a 0 Stratafix was placed in the abdomen. I closed the direct defect with an 0 Stratafix suture.  A XL Bard 3DMax Medium weight mesh was placed within the anterior abdominal wall and secured in place using 2-0 Vicryl immediately above the pubic tubercle and superior on the abdominal wall ensuring no vessels or nerves were incorporated.  After noting proper placement of the mesh with the peritoneal reflection deep to it, the previously created peritoneal flap was secured back up to the anterior abdominal wall using running 3-0 V-Lock.  Any holes created in the peritoneal flap were closed.   On the right, a peritoneal flap was created approximately 5cm cephalad to the defect by using scissors with electrocautery.  Dissection was carried down towards the pubic tubercle,  developing the myopectineal orifice view. Laterally the flap was carried towards the ASIS.  A direct and indirect hernia sac was noted, which carefully dissected away from the adjacent tissues to be fully reduced out of hernia cavity.  Any bleeding was controlled with combination of electrocautery and manual pressure.    After confirming adequate dissection and the peritoneal reflection completely down and away from the cord structures.  The XL Bard mesh, 2-0 Vicryl, 3-0 V-lock and a 0 Stratafix was placed in the abdomen. I closed the direct defect with an 0 Stratafix suture.  A XL Bard 3DMax Medium weight mesh was placed within the anterior abdominal wall and secured in place using 2-0 Vicryl immediately above the pubic tubercle and superior on the abdominal wall ensuring no vessels or nerves were incorporated.  After noting proper placement of the mesh lapping over the middle with the left sided mesh and with the peritoneal reflection deep to it, the previously created peritoneal flap was secured back up to the anterior abdominal wall using running 3-0 V-Lock. Any holes created in the peritoneal flap were closed. All needles were then removed out of the abdominal cavity, Xi platform undocked from the ports and removed off of operative field.  Marcaine was infused as ilioinguinal block and at the port sites.   The abdomen was then desufflated and ports removed. All skin incisions were closed with a subcuticular stitch of Monocryl 4-0. Dermabond was applied. The testis was gently pulled down into its anatomic position in the scrotum.  Final inspection revealed acceptable hemostasis. All counts were correct at the end of the case. The patient was awakened from anesthesia and extubated without complication.  The patient went to the PACU in stable condition.   Algis Greenhouse, MD [facility] [address] [place], [place] [address] [phone] (office) Bilateral

## 2023-08-22 NOTE — Discharge Instructions (Addendum)
Discharge Robotic Assisted Laparoscopic Surgery Instructions:  You will have to some gas in the scrotum and the lower abdomen. You may have some bruising in the lower abdomen and groin.   Common Complaints: Right shoulder pain is common after laparoscopic surgery.  This is secondary to the gas used in the surgery being trapped under the diaphragm.  Walk to help your body absorb the gas. This will improve in a few days. Pain at the port sites are common, especially the larger port sites. This will improve with time.  Some nausea is common and poor appetite. The main goal is to stay hydrated the first few days after surgery.   Diet/ Activity: Diet as tolerated. You may not have an appetite, but it is important to stay hydrated.  Drink 64 ounces of water a day. Your appetite will return with time.  Shower per your regular routine daily.  Do not take hot showers. Take warm showers that are less than 10 minutes. Rest and listen to your body, but do not remain in bed all day.  Walk everyday for at least 15-20 minutes. Deep cough and move around every 1-2 hours in the first few days after surgery.  Do not lift > 10 lbs, perform excessive bending, pushing, pulling, squatting for 4 weeks after surgery. You will need to gradually increase your activity and lifting starting with 20 lbs and then gradually increasing up to lifting your wife.  Do not pick at the dermabond glue on your incision sites.  This glue film will remain in place for 1-2 weeks and will start to peel off.  Do not place lotions or balms on your incision unless instructed to specifically by Dr. Henreitta Leber.   Pain Expectations and Narcotics: -After surgery you will have pain associated with your incisions and this is normal. The pain is muscular and nerve pain, and will get better with time. -You are encouraged and expected to take non narcotic medications like tylenol and ibuprofen (when able) to treat pain as multiple modalities can aid  with pain treatment. -Narcotics are only used when pain is severe or there is breakthrough pain. -You are not expected to have a pain score of 0 after surgery, as we cannot prevent pain. A pain score of 3-4 that allows you to be functional, move, walk, and tolerate some activity is the goal. The pain will continue to improve over the days after surgery and is dependent on your surgery. -Due to  law, we are only able to give a certain amount of pain medication to treat post operative pain, and we only give additional narcotics on a patient by patient basis.  -For most laparoscopic surgery, studies have shown that the majority of patients only need 10-15 narcotic pills, and for open surgeries most patients only need 15-20.   -Having appropriate expectations of pain and knowledge of pain management with non narcotics is important as we do not want anyone to become addicted to narcotic pain medication.  -Using ice packs in the first 48 hours and heating pads after 48 hours, wearing an abdominal binder (when recommended), and using over the counter medications are all ways to help with pain management.   -Simple acts like meditation and mindfulness practices after surgery can also help with pain control and research has proven the benefit of these practices.  Medication: Take tylenol and ibuprofen as needed for pain control, alternating every 4-6 hours.  Example:  Tylenol 1000mg  @ 6am, 12noon, 6pm, (Do not  exceed 4000mg  of tylenol a day). Ibuprofen 800mg  @ 9am, 3pm, 9pm, 3am (Do not exceed 3600mg  of ibuprofen a day).  Take Roxicodone for breakthrough pain every 4 hours.  Take Colace for constipation related to narcotic pain medication. If you do not have a bowel movement in 2 days, take Miralax over the counter.  Drink plenty of water to also prevent constipation.   Contact Information: If you have questions or concerns, please call our office, [phone], Monday- Thursday 8AM-5PM and  Friday 8AM-12Noon.  If it is after hours or on the weekend, please call [facility], [phone], [phone], and ask to speak to the surgeon on call for Dr. Henreitta Leber at [facility].     Post Anesthesia Home Care Instructions  Activity: Get plenty of rest for the remainder of the day. A responsible individual must stay with you for 24 hours following the procedure.  For the next 24 hours, DO NOT: -Drive a car -Advertising copywriter -Drink alcoholic beverages -Take any medication unless instructed by your physician -Make any legal decisions or sign important papers.  Meals: Start with liquid foods such as gelatin or soup. Progress to regular foods as tolerated. Avoid greasy, spicy, heavy foods. If nausea and/or vomiting occur, drink only clear liquids until the nausea and/or vomiting subsides. Call your physician if vomiting continues.  Special Instructions/Symptoms: Your throat may feel dry or sore from the anesthesia or the breathing tube placed in your throat during surgery. If this causes discomfort, gargle with warm salt water. The discomfort should disappear within 24 hours.  If you had a scopolamine patch placed behind your ear for the management of post- operative nausea and/or vomiting:  1. The medication in the patch is effective for 72 hours, after which it should be removed.  Wrap patch in a tissue and discard in the trash. Wash hands thoroughly with soap and water. 2. You may remove the patch earlier than 72 hours if you experience unpleasant side effects which may include dry mouth, dizziness or visual disturbances. 3. Avoid touching the patch. Wash your hands with soap and water after contact with the patch.

## 2023-08-22 NOTE — Progress Notes (Signed)
[facility]  Updated daughter. Rx sent to [facility]. No heavy lifting > 10 lbs, excessive bending, pushing, pulling, or squatting for 4 weeks after surgery.   Future Appointments  Date Time Provider Department Center  09/18/2023  1:30 PM Lucretia Roers, MD RS-RS None  12/12/2023  8:00 AM Anabel Halon, MD R[facility]  05/18/2024  1:50 PM RPC-ANNUAL WELLNESS VISIT R[facility] RPC    Algis Greenhouse, MD [facility] [address] [place], [place] [address] [phone] (office)

## 2023-08-22 NOTE — H&P (Signed)
[facility]   David Rodriguez is a 77 y.o. male.  HPI: David Rodriguez is a 77 yo who I saw a few years back you had bilateral inguinal hernias but they were not symptomatic at that time. He has had worsening of the right groin bulge and continues to have the bulge on the left that he had for over 30 years. He has some discomfort at the end of the day or with activity. He is otherwise doing well. He has tried to cut back on his smoking and is smoking < 5-10 cigarettes a day.   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   Hyperlipidemia    Hypertension    Screening for lung cancer 12/12/2022   Tobacco abuse 10/30/2016    Past Surgical History:  Procedure Laterality Date   ABDOMINAL AORTIC ANEURYSM REPAIR  2013   CARDIAC CATHETERIZATION  2017   TONSILLECTOMY AND ADENOIDECTOMY  1953    Family History  Problem Relation Age of Onset   Hypertension Mother    Cancer Father        MESOTHELIOMA   Cancer Sister        LUNG   Cancer Brother        LUNG    Social History   Tobacco Use   Smoking status: Every Day    Current packs/day: 0.00    Average packs/day: 0.5 packs/day for 50.0 years (25.0 ttl pk-yrs)    Types: Cigarettes    Start date: 08/27/1967    Last attempt to quit: 08/26/2017    Years since quitting: 5.9    Passive exposure: Never   Smokeless tobacco: Never   Tobacco comments:    2-3 cigarettes  a day   Vaping Use   Vaping status: Never Used  Substance Use Topics   Alcohol use: No   Drug use: No    Medications: I have reviewed the patient's current medications. Medications Prior to Admission  Medication Sig Dispense Refill Last Dose   albuterol (VENTOLIN HFA) 108 (90 Base) MCG/ACT inhaler Inhale 2 puffs into the lungs every 6 (six) hours as needed for wheezing or shortness of breath. 18 g 5 Past Week   ANORO ELLIPTA 62.5-25 MCG/ACT AEPB USE 1 INHALATION BY MOUTH DAILY   AT 6 AM 180 each 3 08/22/2023   aspirin EC 81 MG tablet Take 81 mg by mouth every evening.   08/21/2023   atorvastatin (LIPITOR) 40 MG tablet TAKE 1 TABLET BY MOUTH DAILY (Patient taking differently: Take 40 mg by mouth every evening.) 90 tablet 3 08/21/2023   Cholecalciferol (VITAMIN D-3) 125 MCG (5000 UT) TABS Take 5,000 Units by mouth every evening.   08/21/2023   losartan (COZAAR) 25 MG tablet TAKE 1 TABLET BY MOUTH DAILY (Patient taking differently: Take 25 mg by mouth every evening.) 90 tablet 3 08/21/2023   sennosides-docusate sodium (SENOKOT-S) 8.6-50 MG tablet Take 1-2 tablets by mouth daily as needed for constipation.   08/21/2023    Allergies  Allergen Reactions   Iodine Hives      ROS:  A comprehensive review of systems was negative except for: Gastrointestinal: positive for abdominal pain  Blood pressure 127/74, pulse 67, temperature 97.9 F (36.6 C), temperature source Oral, resp. rate 16, SpO2 94%. Physical Exam Vitals reviewed.  HENT:     Head: Normocephalic.     Nose: Nose normal.  Eyes:     Extraocular Movements: Extraocular movements intact.  Cardiovascular:     Rate and Rhythm: Normal rate.  Pulmonary:     Effort: Pulmonary effort is normal.  Abdominal:     General: There is no distension.     Palpations: Abdomen is soft.     Hernia: A hernia is present.  Musculoskeletal:        General: Normal range of motion.  Skin:    General: Skin is warm.  Neurological:     General: No focal deficit present.     Mental Status: He is alert.  Psychiatric:        Mood and Affect: Mood normal.        Behavior: Behavior normal.     Results: None   Assessment & Plan:  David Rodriguez is a 77 y.o. male with bilateral inguinal hernias.   Discussed the risk and benefits including, bleeding, infection, use of mesh, risk of recurrence, risk of nerve damage causing numbness or changes in sensation, risk of damage to the cord structures. The patient understands the risk and  benefits of repair with mesh, and has decided to proceed.  We also discussed open versus robotic assisted laparoscopic surgery and the use of mesh. We discussed that I do both robotic and open repairs with mesh, and that these are considered equivalent. We discussed reasons for opting for laparoscopic surgery including if a bilateral repair is needed or if a patient has a recurrence after an open repair. We discussed the option of watch and wait in men and discussed that in 5 years some studies report that 40% of men have crossed over to needing a hernia repair because the hernia has become larger or symptomatic. We discussed that women are not appropriate candidate for watchful waiting due to the risk of femoral hernias.   Plan for bilateral hernia repair.   All questions asked. [facility] Daughter is picking him up. [phone].   Lucretia Roers 08/22/2023, 7:25 AM

## 2023-08-22 NOTE — Transfer of Care (Signed)
Immediate Anesthesia Transfer of Care Note  Patient: David Rodriguez  Procedure(s) Performed: XI ROBOTIC ASSISTED BILATERAL INGUINAL HERNIA (Bilateral: Abdomen)  Patient Location: PACU  Anesthesia Type:General  Level of Consciousness: awake, alert , oriented, and patient cooperative  Airway & Oxygen Therapy: Patient Spontanous Breathing and Patient connected to face mask oxygen  Post-op Assessment: Report given to RN, Post -op Vital signs reviewed and stable, and Patient moving all extremities X 4  Post vital signs: Reviewed and stable  Last Vitals:  Vitals Value Taken Time  BP 158/97 08/22/23 1020  Temp    Pulse 95 08/22/23 1027  Resp 21 08/22/23 1027  SpO2 96 % 08/22/23 1027  Vitals shown include unfiled device data.  Last Pain:  Vitals:   08/22/23 0704  TempSrc: Oral  PainSc: 0-No pain      Patients Stated Pain Goal: 6 (08/22/23 0704)  Complications: No notable events documented.

## 2023-08-22 NOTE — Anesthesia Procedure Notes (Signed)
Procedure Name: Intubation Date/Time: 08/22/2023 7:41 AM  Performed by: Shanon Payor, CRNAPre-anesthesia Checklist: Patient identified, Emergency Drugs available, Suction available, Patient being monitored and Timeout performed Patient Re-evaluated:Patient Re-evaluated prior to induction Oxygen Delivery Method: Circle system utilized Preoxygenation: Pre-oxygenation with 100% oxygen Induction Type: IV induction Ventilation: Mask ventilation without difficulty Laryngoscope Size: Mac and 3 Grade View: Grade I Tube type: Oral Tube size: 7.5 mm Number of attempts: 1 Airway Equipment and Method: Stylet Placement Confirmation: ETT inserted through vocal cords under direct vision, positive ETCO2, CO2 detector and breath sounds checked- equal and bilateral Secured at: 23 cm Tube secured with: Tape Dental Injury: Teeth and Oropharynx as per pre-operative assessment

## 2023-08-24 NOTE — Anesthesia Postprocedure Evaluation (Signed)
Anesthesia Post Note  Patient: David Rodriguez  Procedure(s) Performed: XI ROBOTIC ASSISTED BILATERAL INGUINAL HERNIA (Bilateral: Abdomen)  Patient location during evaluation: Phase II Anesthesia Type: General Level of consciousness: awake Pain management: pain level controlled Vital Signs Assessment: post-procedure vital signs reviewed and stable Respiratory status: spontaneous breathing and respiratory function stable Cardiovascular status: blood pressure returned to baseline and stable Postop Assessment: no headache and no apparent nausea or vomiting Anesthetic complications: no Comments: Late entry   No notable events documented.   Last Vitals:  Vitals:   08/22/23 1115 08/22/23 1124  BP: 128/65   Pulse: 68 74  Resp: 17 18  Temp:  36.6 C  SpO2: 93% 94%    Last Pain:  Vitals:   08/22/23 1124  TempSrc: Oral  PainSc: 3                  Windell Norfolk

## 2023-09-18 ENCOUNTER — Encounter: Payer: Self-pay | Admitting: General Surgery

## 2023-09-18 ENCOUNTER — Ambulatory Visit (INDEPENDENT_AMBULATORY_CARE_PROVIDER_SITE_OTHER): Payer: Medicare Other | Admitting: General Surgery

## 2023-09-18 VITALS — BP 134/83 | HR 88 | Temp 97.8°F | Resp 16 | Ht 69.0 in | Wt 165.0 lb

## 2023-09-18 DIAGNOSIS — K4020 Bilateral inguinal hernia, without obstruction or gangrene, not specified as recurrent: Secondary | ICD-10-CM

## 2023-09-18 NOTE — Patient Instructions (Signed)
Call with issues. Seroma on the right side is fluid in the area where the hernia was located. This will re-absorb with time but can take 3- 6 months. Call if any concerns.  Gradually start to lift >10 lbs over the next 1-2 weeks. Otherwise activity as tolerated.

## 2023-09-19 NOTE — Progress Notes (Signed)
[facility]  Doing well after surgery. Has some swelling in the right groin but no pain. His wife passed away on Thanksgiving.   BP 134/83   Pulse 88   Temp 97.8 F (36.6 C) (Oral)   Resp 16   Ht 5\' 9"  (1.753 m)   Wt 165 lb (74.8 kg)   SpO2 92%   BMI 24.37 kg/m  Right groin with seroma, no signs of recurrence, port sites c/d/I with dermabond  Patient s/p robotic assisted bilateral inguinal hernia repair with mesh.  Call with issues. Seroma on the right side is fluid in the area where the hernia was located. This will re-absorb with time but can take 3- 6 months. Call if any concerns.  Gradually start to lift >10 lbs over the next 1-2 weeks. Otherwise activity as tolerated.   Algis Greenhouse, MD [facility] [address] [place], [place] [address] [phone] (office)

## 2023-11-04 ENCOUNTER — Other Ambulatory Visit: Payer: Self-pay | Admitting: Internal Medicine

## 2023-11-04 DIAGNOSIS — R911 Solitary pulmonary nodule: Secondary | ICD-10-CM | POA: Insufficient documentation

## 2023-11-29 DIAGNOSIS — H5203 Hypermetropia, bilateral: Secondary | ICD-10-CM | POA: Diagnosis not present

## 2023-11-29 DIAGNOSIS — H2513 Age-related nuclear cataract, bilateral: Secondary | ICD-10-CM | POA: Diagnosis not present

## 2023-11-29 DIAGNOSIS — H52203 Unspecified astigmatism, bilateral: Secondary | ICD-10-CM | POA: Diagnosis not present

## 2023-12-09 ENCOUNTER — Other Ambulatory Visit: Payer: Self-pay

## 2023-12-09 DIAGNOSIS — E785 Hyperlipidemia, unspecified: Secondary | ICD-10-CM

## 2023-12-09 DIAGNOSIS — E0789 Other specified disorders of thyroid: Secondary | ICD-10-CM

## 2023-12-09 DIAGNOSIS — E559 Vitamin D deficiency, unspecified: Secondary | ICD-10-CM | POA: Diagnosis not present

## 2023-12-09 DIAGNOSIS — I10 Essential (primary) hypertension: Secondary | ICD-10-CM | POA: Diagnosis not present

## 2023-12-09 DIAGNOSIS — R7303 Prediabetes: Secondary | ICD-10-CM

## 2023-12-09 DIAGNOSIS — Z125 Encounter for screening for malignant neoplasm of prostate: Secondary | ICD-10-CM

## 2023-12-10 LAB — CBC WITH DIFFERENTIAL/PLATELET
Basophils Absolute: 0.1 10*3/uL (ref 0.0–0.2)
Basos: 1 %
EOS (ABSOLUTE): 0.1 10*3/uL (ref 0.0–0.4)
Eos: 1 %
Hematocrit: 47.2 % (ref 37.5–51.0)
Hemoglobin: 15.8 g/dL (ref 13.0–17.7)
Immature Grans (Abs): 0 10*3/uL (ref 0.0–0.1)
Immature Granulocytes: 0 %
Lymphocytes Absolute: 1.6 10*3/uL (ref 0.7–3.1)
Lymphs: 24 %
MCH: 30.6 pg (ref 26.6–33.0)
MCHC: 33.5 g/dL (ref 31.5–35.7)
MCV: 91 fL (ref 79–97)
Monocytes Absolute: 0.7 10*3/uL (ref 0.1–0.9)
Monocytes: 10 %
Neutrophils Absolute: 4.3 10*3/uL (ref 1.4–7.0)
Neutrophils: 64 %
Platelets: 279 10*3/uL (ref 150–450)
RBC: 5.17 x10E6/uL (ref 4.14–5.80)
RDW: 13.2 % (ref 11.6–15.4)
WBC: 6.7 10*3/uL (ref 3.4–10.8)

## 2023-12-10 LAB — VITAMIN D 25 HYDROXY (VIT D DEFICIENCY, FRACTURES): Vit D, 25-Hydroxy: 41.3 ng/mL (ref 30.0–100.0)

## 2023-12-10 LAB — LIPID PANEL
Chol/HDL Ratio: 3.2 {ratio} (ref 0.0–5.0)
Cholesterol, Total: 150 mg/dL (ref 100–199)
HDL: 47 mg/dL (ref >39)
LDL Chol Calc (NIH): 85 mg/dL (ref 0–99)
Triglycerides: 99 mg/dL (ref 0–149)
VLDL Cholesterol Cal: 18 mg/dL (ref 5–40)

## 2023-12-10 LAB — HEMOGLOBIN A1C
Est. average glucose Bld gHb Est-mCnc: 126 mg/dL
Hgb A1c MFr Bld: 6 % — ABNORMAL HIGH (ref 4.8–5.6)

## 2023-12-10 LAB — CMP14+EGFR
ALT: 10 [IU]/L (ref 0–44)
AST: 14 [IU]/L (ref 0–40)
Albumin: 4.2 g/dL (ref 3.8–4.8)
Alkaline Phosphatase: 83 [IU]/L (ref 44–121)
BUN/Creatinine Ratio: 12 (ref 10–24)
BUN: 11 mg/dL (ref 8–27)
Bilirubin Total: 0.8 mg/dL (ref 0.0–1.2)
CO2: 24 mmol/L (ref 20–29)
Calcium: 9.1 mg/dL (ref 8.6–10.2)
Chloride: 101 mmol/L (ref 96–106)
Creatinine, Ser: 0.89 mg/dL (ref 0.76–1.27)
Globulin, Total: 2.2 g/dL (ref 1.5–4.5)
Glucose: 86 mg/dL (ref 70–99)
Potassium: 4.6 mmol/L (ref 3.5–5.2)
Sodium: 137 mmol/L (ref 134–144)
Total Protein: 6.4 g/dL (ref 6.0–8.5)
eGFR: 87 mL/min/{1.73_m2} (ref >59)

## 2023-12-10 LAB — TSH: TSH: 1.26 u[IU]/mL (ref 0.450–4.500)

## 2023-12-10 LAB — PSA: Prostate Specific Ag, Serum: 3.6 ng/mL (ref 0.0–4.0)

## 2023-12-12 ENCOUNTER — Encounter: Payer: Self-pay | Admitting: Internal Medicine

## 2023-12-12 ENCOUNTER — Ambulatory Visit (INDEPENDENT_AMBULATORY_CARE_PROVIDER_SITE_OTHER): Payer: Medicare Other | Admitting: Internal Medicine

## 2023-12-12 VITALS — BP 132/70 | HR 80 | Ht 69.0 in | Wt 173.4 lb

## 2023-12-12 DIAGNOSIS — I10 Essential (primary) hypertension: Secondary | ICD-10-CM

## 2023-12-12 DIAGNOSIS — I724 Aneurysm of artery of lower extremity: Secondary | ICD-10-CM

## 2023-12-12 DIAGNOSIS — Z720 Tobacco use: Secondary | ICD-10-CM | POA: Diagnosis not present

## 2023-12-12 DIAGNOSIS — I714 Abdominal aortic aneurysm, without rupture, unspecified: Secondary | ICD-10-CM | POA: Diagnosis not present

## 2023-12-12 DIAGNOSIS — R7303 Prediabetes: Secondary | ICD-10-CM

## 2023-12-12 DIAGNOSIS — R0609 Other forms of dyspnea: Secondary | ICD-10-CM | POA: Diagnosis not present

## 2023-12-12 DIAGNOSIS — K4020 Bilateral inguinal hernia, without obstruction or gangrene, not specified as recurrent: Secondary | ICD-10-CM

## 2023-12-12 DIAGNOSIS — J432 Centrilobular emphysema: Secondary | ICD-10-CM | POA: Diagnosis not present

## 2023-12-12 DIAGNOSIS — F4321 Adjustment disorder with depressed mood: Secondary | ICD-10-CM

## 2023-12-12 MED ORDER — LOSARTAN POTASSIUM 25 MG PO TABS: 25.0000 mg | ORAL_TABLET | Freq: Every day | ORAL | 3 refills | Status: AC

## 2023-12-12 NOTE — Assessment & Plan Note (Signed)
 s/p robotic assisted bilateral inguinal hernia repair with mesh in 11/24

## 2023-12-12 NOTE — Assessment & Plan Note (Signed)
Smokes about 0.25 pack/day  Asked about quitting: confirms that he/she currently smokes cigarettes Advise to quit smoking: Educated about QUITTING to reduce the risk of cancer, cardio and cerebrovascular disease. Assess willingness: Unwilling to quit at this time, but is working on cutting back. Assist with counseling and pharmacotherapy: Counseled for 5 minutes and literature provided. Arrange for follow up: follow up in 3 months and continue to offer help. 

## 2023-12-12 NOTE — Assessment & Plan Note (Signed)
History of domino aortic aneurysm repair in 2011 in [place]. Followed by Vascular surgery Needs to quit smoking

## 2023-12-12 NOTE — Assessment & Plan Note (Addendum)
 Checked echo-LVEF 45-50% with global wall hypokinesis of LV Check BNP Denies orthopnea or PND Mostly exertional dyspnea, also has history of COPD Reports feeling better when he does not smoke If worsening of dyspnea, will need cardiac workup

## 2023-12-12 NOTE — Assessment & Plan Note (Signed)
 Due to loss of his wife in 11/24 Gets grief counseling through his wife's hospice team

## 2023-12-12 NOTE — Assessment & Plan Note (Signed)
 BP Readings from Last 1 Encounters:  12/12/23 132/70   Well-controlled with losartan 25 mg daily now Counseled for compliance with the medications Advised DASH diet and moderate exercise/walking, at least 150 mins/week

## 2023-12-12 NOTE — Progress Notes (Signed)
 Established Patient Office Visit  Subjective:  Patient ID: David Rodriguez, male    DOB: [birth year]  Age: 78 y.o. MRN: [id number]  CC:  Chief Complaint  Patient presents with   Care Management    Follow up visit.    HPI David Rodriguez is a 78 y.o. male with past medical history of CAD, HTN, COPD and tobacco abuse who presents for f/u of his chronic medical conditions.  HTN: BP is well-controlled. Takes medications regularly. Patient denies headache, dizziness, chest pain, or palpitations.   COPD: Well-controlled with Anoro and PRN Albuterol. Still smokes about 3-5 cigarettes per day. His low-dose CT chest showed emphysema and pulmonary nodules.  It also showed calcification of aortic valve and mitral annulus.  He has mild dyspnea, but reports that he feels better on the days he does not smoke.  Denies any orthopnea or PND currently.  He has history of AAA, s/p EVAR.  He denies any abdominal pain currently.  He also has popliteal artery aneurysm, but denies any claudication symptoms currently.  Denies any leg swelling currently.  He is followed by vascular surgery.   Grief reaction: He lost his wife in 11/24, who was suffering from neurology condition. He had anxiety and caregiver fatigue before, but has been feeling lonely now. He is getting grief counseling through hospice services.  Denies any SI or HI currently.   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   Hyperlipidemia    Hypertension    Screening for lung cancer 12/12/2022   Tobacco abuse 10/30/2016    Past Surgical History:  Procedure Laterality Date   ABDOMINAL AORTIC ANEURYSM REPAIR  2013   CARDIAC CATHETERIZATION  2017   TONSILLECTOMY AND ADENOIDECTOMY  1953    Family History  Problem Relation Age of Onset   Hypertension Mother    Cancer Father        MESOTHELIOMA   Cancer Sister        LUNG   Cancer Brother         LUNG    Social History   Socioeconomic History   Marital status: Married    Spouse name: [name]   Number of children: 3   Years of education: 13   Highest education level: Some college, no degree  Occupational History   Occupation: RETIRED    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  Tobacco Use   Smoking status: Every Day    Current packs/day: 0.00    Average packs/day: 0.5 packs/day for 50.0 years (25.0 ttl pk-yrs)    Types: Cigarettes    Start date: 08/27/1967    Last attempt to quit: 08/26/2017    Years since quitting: 6.2    Passive exposure: Never   Smokeless tobacco: Never   Tobacco comments:    2-3 cigarettes  a day   Vaping Use   Vaping status: Never Used  Substance and Sexual Activity   Alcohol use: No   Drug use: No   Sexual activity: Not Currently  Other Topics Concern   Not on file  Social History Narrative   Moved from [place]   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   Social Drivers of Health   Financial Resource Strain: Low Risk  (12/08/2023)   Overall Financial Resource Strain (CARDIA)    Difficulty of Paying Living Expenses: Not hard at all  Food Insecurity: No Food Insecurity (  12/08/2023)   Hunger Vital Sign    Worried About Running Out of Food in the Last Year: Never true    Ran Out of Food in the Last Year: Never true  Transportation Needs: No Transportation Needs (12/08/2023)   PRAPARE - Administrator, Civil Service (Medical): No    Lack of Transportation (Non-Medical): No  Physical Activity: Insufficiently Active (12/08/2023)   Exercise Vital Sign    Days of Exercise per Week: 3 days    Minutes of Exercise per Session: 30 min  Stress: No Stress Concern Present (12/08/2023)   [employer] of Occupational Health - Occupational Stress Questionnaire    Feeling of Stress : Only a little  Social Connections: Socially Isolated (12/08/2023)   Social Connection and Isolation Panel [NHANES]    Frequency of Communication with Friends and Family: More than  three times a week    Frequency of Social Gatherings with Friends and Family: Three times a week    Attends Religious Services: Never    Active Member of Clubs or Organizations: No    Attends Banker Meetings: Never    Marital Status: Widowed  Intimate Partner Violence: Not At Risk (05/13/2023)   Humiliation, Afraid, Rape, and Kick questionnaire    Fear of Current or Ex-Partner: No    Emotionally Abused: No    Physically Abused: No    Sexually Abused: No    Outpatient Medications Prior to Visit  Medication Sig Dispense Refill   albuterol (VENTOLIN HFA) 108 (90 Base) MCG/ACT inhaler Inhale 2 puffs into the lungs every 6 (six) hours as needed for wheezing or shortness of breath. 18 g 5   ANORO ELLIPTA 62.5-25 MCG/ACT AEPB USE 1 INHALATION BY MOUTH DAILY  AT 6 AM 180 each 3   aspirin EC 81 MG tablet Take 81 mg by mouth every evening.     atorvastatin (LIPITOR) 40 MG tablet TAKE 1 TABLET BY MOUTH DAILY (Patient taking differently: Take 40 mg by mouth every evening.) 90 tablet 3   Cholecalciferol (VITAMIN D-3) 125 MCG (5000 UT) TABS Take 5,000 Units by mouth every evening.     ondansetron (ZOFRAN) 4 MG tablet Take 1 tablet (4 mg total) by mouth every 8 (eight) hours as needed. 30 tablet 1   sennosides-docusate sodium (SENOKOT-S) 8.6-50 MG tablet Take 1-2 tablets by mouth daily as needed for constipation.     losartan (COZAAR) 25 MG tablet TAKE 1 TABLET BY MOUTH DAILY (Patient taking differently: Take 25 mg by mouth every evening.) 90 tablet 3   oxyCODONE (ROXICODONE) 5 MG immediate release tablet Take 1 tablet (5 mg total) by mouth every 4 (four) hours as needed for severe pain (pain score 7-10) or breakthrough pain. 10 tablet 0   No facility-administered medications prior to visit.    Allergies  Allergen Reactions   Iodine Hives    ROS Review of Systems  Constitutional:  Negative for chills and fever.  HENT:  Negative for congestion and sore throat.   Eyes:  Negative for  pain and discharge.  Respiratory:  Positive for shortness of breath. Negative for cough.   Cardiovascular:  Negative for chest pain and palpitations.  Gastrointestinal:  Positive for constipation. Negative for diarrhea, nausea and vomiting.  Endocrine: Negative for polydipsia and polyuria.  Genitourinary:  Negative for dysuria and hematuria.  Musculoskeletal:  Negative for neck pain and neck stiffness.  Skin:  Negative for rash.  Neurological:  Negative for dizziness, weakness, numbness and headaches.  Psychiatric/Behavioral:  Negative for agitation and behavioral problems.       Objective:    Physical Exam Vitals reviewed.  Constitutional:      General: He is not in acute distress.    Appearance: He is not diaphoretic.  HENT:     Head: Normocephalic and atraumatic.     Nose: Nose normal.     Mouth/Throat:     Mouth: Mucous membranes are moist.  Eyes:     General: No scleral icterus.    Extraocular Movements: Extraocular movements intact.  Cardiovascular:     Rate and Rhythm: Normal rate and regular rhythm.     Pulses: Normal pulses.     Heart sounds: Normal heart sounds. No murmur heard. Pulmonary:     Breath sounds: Normal breath sounds. No wheezing or rales.  Musculoskeletal:     Cervical back: Neck supple. No tenderness.     Right lower leg: Edema (Trace) present.     Left lower leg: Edema (Trace) present.  Skin:    General: Skin is warm.     Findings: No rash.  Neurological:     General: No focal deficit present.     Mental Status: He is alert and oriented to person, place, and time.     Sensory: No sensory deficit.     Motor: No weakness.  Psychiatric:        Mood and Affect: Mood normal.        Behavior: Behavior normal.     BP 132/70 (BP Location: Left Arm)   Pulse 80   Ht 5\' 9"  (1.753 m)   Wt 173 lb 6.4 oz (78.7 kg)   SpO2 92%   BMI 25.61 kg/m  Wt Readings from Last 3 Encounters:  12/12/23 173 lb 6.4 oz (78.7 kg)  09/18/23 165 lb (74.8 kg)   08/20/23 171 lb 15.3 oz (78 kg)    Lab Results  Component Value Date   TSH 1.260 12/09/2023   Lab Results  Component Value Date   WBC 6.7 12/09/2023   HGB 15.8 12/09/2023   HCT 47.2 12/09/2023   MCV 91 12/09/2023   PLT 279 12/09/2023   Lab Results  Component Value Date   NA 137 12/09/2023   K 4.6 12/09/2023   CO2 24 12/09/2023   GLUCOSE 86 12/09/2023   BUN 11 12/09/2023   CREATININE 0.89 12/09/2023   BILITOT 0.8 12/09/2023   ALKPHOS 83 12/09/2023   AST 14 12/09/2023   ALT 10 12/09/2023   PROT 6.4 12/09/2023   ALBUMIN 4.2 12/09/2023   CALCIUM 9.1 12/09/2023   EGFR 87 12/09/2023   Lab Results  Component Value Date   CHOL 150 12/09/2023   Lab Results  Component Value Date   HDL 47 12/09/2023   Lab Results  Component Value Date   LDLCALC 85 12/09/2023   Lab Results  Component Value Date   TRIG 99 12/09/2023   Lab Results  Component Value Date   CHOLHDL 3.2 12/09/2023   Lab Results  Component Value Date   HGBA1C 6.0 (H) 12/09/2023      Assessment & Plan:   Problem List Items Addressed This Visit       Cardiovascular and Mediastinum   AAA (abdominal aortic aneurysm) without rupture (HCC)   History of domino aortic aneurysm repair in 2011 in [place]. Followed by Vascular surgery Needs to quit smoking      Relevant Medications   losartan (COZAAR) 25 MG tablet   Popliteal artery  aneurysm (HCC)   H/o popliteal aneurysms Followed by Vascular surgery Stable in size      Relevant Medications   losartan (COZAAR) 25 MG tablet   Essential hypertension - Primary   BP Readings from Last 1 Encounters:  12/12/23 132/70   Well-controlled with losartan 25 mg daily now Counseled for compliance with the medications Advised DASH diet and moderate exercise/walking, at least 150 mins/week      Relevant Medications   losartan (COZAAR) 25 MG tablet   Other Relevant Orders   CMP14+EGFR     Respiratory   COPD (chronic obstructive pulmonary  disease) (HCC)   Well-controlled with Anoro and PRN albuterol, refilled        Other   Tobacco abuse   Smokes about 0.25 pack/day  Asked about quitting: confirms that he/she currently smokes cigarettes Advise to quit smoking: Educated about QUITTING to reduce the risk of cancer, cardio and cerebrovascular disease. Assess willingness: Unwilling to quit at this time, but is working on cutting back. Assist with counseling and pharmacotherapy: Counseled for 5 minutes and literature provided. Arrange for follow up: follow up in 3 months and continue to offer help.      Bilateral inguinal hernia without obstruction or gangrene   s/p robotic assisted bilateral inguinal hernia repair with mesh in 11/24      Prediabetes   Lab Results  Component Value Date   HGBA1C 6.0 (H) 12/09/2023   Advised to follow low carb diet for now      Relevant Orders   CMP14+EGFR   Hemoglobin A1c   Grief reaction   Due to loss of his wife in 11/24 Gets grief counseling through his wife's hospice team      Exertional dyspnea   Checked echo-LVEF 45-50% with global wall hypokinesis of LV Check BNP Denies orthopnea or PND Mostly exertional dyspnea, also has history of COPD Reports feeling better when he does not smoke If worsening of dyspnea, will need cardiac workup       Relevant Orders   B Nat Peptide      Meds ordered this encounter  Medications   losartan (COZAAR) 25 MG tablet    Sig: Take 1 tablet (25 mg total) by mouth daily.    Dispense:  100 tablet    Refill:  3    Requesting 1 year supply    Follow-up: Return in about 6 months (around 06/10/2024) for HTN and COPD.    Anabel Halon, MD

## 2023-12-12 NOTE — Assessment & Plan Note (Signed)
Well-controlled with Anoro and PRN albuterol, refilled 

## 2023-12-12 NOTE — Assessment & Plan Note (Signed)
 Lab Results  Component Value Date   HGBA1C 6.0 (H) 12/09/2023   Advised to follow low carb diet for now

## 2023-12-12 NOTE — Assessment & Plan Note (Signed)
 H/o popliteal aneurysms Followed by Vascular surgery Stable in size

## 2023-12-12 NOTE — Patient Instructions (Signed)
Please continue to take medications as prescribed.  Please continue to follow low carb diet and perform moderate exercise/walking as tolerated.  Please get fasting blood tests done before the next visit.

## 2023-12-23 ENCOUNTER — Ambulatory Visit (HOSPITAL_COMMUNITY)
Admission: RE | Admit: 2023-12-23 | Discharge: 2023-12-23 | Disposition: A | Discharge status: Home / Self Care | Payer: Medicare Other | Source: Ambulatory Visit | Attending: Internal Medicine | Admitting: Internal Medicine

## 2023-12-23 DIAGNOSIS — J432 Centrilobular emphysema: Secondary | ICD-10-CM | POA: Diagnosis not present

## 2023-12-23 DIAGNOSIS — R911 Solitary pulmonary nodule: Secondary | ICD-10-CM | POA: Insufficient documentation

## 2024-01-04 ENCOUNTER — Encounter: Payer: Self-pay | Admitting: Internal Medicine

## 2024-01-06 ENCOUNTER — Telehealth: Admitting: Family Medicine

## 2024-01-06 ENCOUNTER — Telehealth: Admitting: Physician Assistant

## 2024-01-06 DIAGNOSIS — J441 Chronic obstructive pulmonary disease with (acute) exacerbation: Secondary | ICD-10-CM | POA: Diagnosis not present

## 2024-01-06 DIAGNOSIS — J069 Acute upper respiratory infection, unspecified: Secondary | ICD-10-CM

## 2024-01-06 DIAGNOSIS — R6889 Other general symptoms and signs: Secondary | ICD-10-CM

## 2024-01-06 MED ORDER — PREDNISONE 20 MG PO TABS: 40.0000 mg | ORAL_TABLET | Freq: Every day | ORAL | 0 refills | Status: AC

## 2024-01-06 MED ORDER — BENZONATATE 100 MG PO CAPS: 100.0000 mg | ORAL_CAPSULE | Freq: Three times a day (TID) | ORAL | 0 refills | Status: DC | PRN

## 2024-01-06 MED ORDER — AZITHROMYCIN 250 MG PO TABS: ORAL_TABLET | ORAL | 0 refills | Status: AC

## 2024-01-06 NOTE — Progress Notes (Signed)
   Thank you for the details you included in the comment boxes. Those details are very helpful in determining the best course of treatment for you and help [place] to provide the best care.Because COPD and Flu symptoms, we recommend that you schedule a Virtual Urgent Care video visit in order for the provider to better assess what is going on.  The provider will be able to give you a more accurate diagnosis and treatment plan if we can more freely discuss your symptoms and with the addition of a virtual examination.   If you change your visit to a video visit, we will bill your insurance (similar to an office visit) and you will not be charged for this e-Visit. You will be able to stay at home and speak with the first available [facility] advanced practice provider. The link to do a video visit is in the drop down Menu tab of your Welcome screen in MyChart.

## 2024-01-06 NOTE — Patient Instructions (Signed)
 David Rodriguez, thank you for joining Tylene Fantasia Ward, PA-C for today's virtual visit.  While this provider is not your primary care provider (PCP), if your PCP is located in our provider database this encounter information will be shared with them immediately following your visit.   A [facility] MyChart account gives you access to today's visit and all your visits, tests, and labs performed at [facility] " click here if you don't have a [facility] MyChart account or go to [email or web address]  Consent: (Patient) David Rodriguez provided verbal consent for this virtual visit at the beginning of the encounter.  Current Medications:  Current Outpatient Medications:    azithromycin (ZITHROMAX) 250 MG tablet, Take 2 tablets on day 1, then 1 tablet daily on days 2 through 5, Disp: 6 tablet, Rfl: 0   benzonatate (TESSALON) 100 MG capsule, Take 1 capsule (100 mg total) by mouth 3 (three) times daily as needed., Disp: 20 capsule, Rfl: 0   predniSONE (DELTASONE) 20 MG tablet, Take 2 tablets (40 mg total) by mouth daily with breakfast for 5 days., Disp: 10 tablet, Rfl: 0   albuterol (VENTOLIN HFA) 108 (90 Base) MCG/ACT inhaler, Inhale 2 puffs into the lungs every 6 (six) hours as needed for wheezing or shortness of breath., Disp: 18 g, Rfl: 5   ANORO ELLIPTA 62.5-25 MCG/ACT AEPB, USE 1 INHALATION BY MOUTH DAILY  AT 6 AM, Disp: 180 each, Rfl: 3   aspirin EC 81 MG tablet, Take 81 mg by mouth every evening., Disp: , Rfl:    atorvastatin (LIPITOR) 40 MG tablet, TAKE 1 TABLET BY MOUTH DAILY (Patient taking differently: Take 40 mg by mouth every evening.), Disp: 90 tablet, Rfl: 3   Cholecalciferol (VITAMIN D-3) 125 MCG (5000 UT) TABS, Take 5,000 Units by mouth every evening., Disp: , Rfl:    losartan (COZAAR) 25 MG tablet, Take 1 tablet (25 mg total) by mouth daily., Disp: 100 tablet, Rfl: 3   ondansetron (ZOFRAN) 4 MG tablet, Take 1 tablet (4 mg total) by mouth every 8 (eight) hours as needed.,  Disp: 30 tablet, Rfl: 1   sennosides-docusate sodium (SENOKOT-S) 8.6-50 MG tablet, Take 1-2 tablets by mouth daily as needed for constipation., Disp: , Rfl:    Medications ordered in this encounter:  Meds ordered this encounter  Medications   predniSONE (DELTASONE) 20 MG tablet    Sig: Take 2 tablets (40 mg total) by mouth daily with breakfast for 5 days.    Dispense:  10 tablet    Refill:  0    Supervising Provider:   Merrilee Jansky [[id number]]   azithromycin (ZITHROMAX) 250 MG tablet    Sig: Take 2 tablets on day 1, then 1 tablet daily on days 2 through 5    Dispense:  6 tablet    Refill:  0    Supervising Provider:   Merrilee Jansky [[id number]]   benzonatate (TESSALON) 100 MG capsule    Sig: Take 1 capsule (100 mg total) by mouth 3 (three) times daily as needed.    Dispense:  20 capsule    Refill:  0    Supervising Provider:   Merrilee Jansky [[id number]]     *If you need refills on other medications prior to your next appointment, please contact your pharmacy*  Follow-Up: Call back or seek an in-person evaluation if the symptoms worsen or if the condition fails to improve as anticipated.  [facility] [phone]  Other Instructions Take antibiotic as  prescribed.  Take prednisone as prescribed.  Use tessalon as needed for cough.  Can continue with mucinex and albuterol inhaler as needed.  If symptoms become worse please follow up with Urgent Care or the Emergency Department for evaluation.    If you have been instructed to have an in-person evaluation today at a local Urgent Care facility, please use the link below. It will take you to a list of all of our available [facility], including address, phone number and hours of operation. Please do not delay care.  [facility]  If you or a family member do not have a primary care provider, use the link below to schedule a visit and establish care. When you choose a [facility] primary care  physician or advanced practice provider, you gain a long-term partner in health. Find a Primary Care Provider  Learn more about [facility]'s in-office and virtual care options: [facility] - Get Care Now

## 2024-01-06 NOTE — Progress Notes (Signed)
 Virtual Visit Consent   David Rodriguez, you are scheduled for a virtual visit with a [facility] provider today. Just as with appointments in the office, your consent must be obtained to participate. Your consent will be active for this visit and any virtual visit you may have with one of our providers in the next 365 days. If you have a MyChart account, a copy of this consent can be sent to you electronically.  As this is a virtual visit, video technology does not allow for your provider to perform a traditional examination. This may limit your provider's ability to fully assess your condition. If your provider identifies any concerns that need to be evaluated in person or the need to arrange testing (such as labs, EKG, etc.), we will make arrangements to do so. Although advances in technology are sophisticated, we cannot ensure that it will always work on either your end or our end. If the connection with a video visit is poor, the visit may have to be switched to a telephone visit. With either a video or telephone visit, we are not always able to ensure that we have a secure connection.  By engaging in this virtual visit, you consent to the provision of healthcare and authorize for your insurance to be billed (if applicable) for the services provided during this visit. Depending on your insurance coverage, you may receive a charge related to this service.  I need to obtain your verbal consent now. Are you willing to proceed with your visit today? David Rodriguez has provided verbal consent on 01/06/2024 for a virtual visit (video or telephone). Tylene Fantasia Ward, PA-C  Date: 01/06/2024 6:19 PM   Virtual Visit via Video Note   I, Tylene Fantasia Ward, connected with  David Rodriguez  ([id number], 13-May-1946) on 01/06/24 at  6:15 PM EDT by a video-enabled telemedicine application and verified that I am speaking with the correct person using two identifiers.  Location: Patient: Virtual Visit Location Patient:  Home Provider: Virtual Visit Location Provider: Home Office   I discussed the limitations of evaluation and management by telemedicine and the availability of in person appointments. The patient expressed understanding and agreed to proceed.    History of Present Illness: David Rodriguez is a 78 y.o. who identifies as a male who was assigned male at birth, and is being seen today for coughing, congestion that started four days.  Pt reports he quit smoking 4 days ago.  Reports he has taken Mucinex with some relief.  He denies fevers.  He reports decreased appetite. He reports he had a family member visiting.  He has a h/o COPD and has used albuterol with minimal. He denies wheezing, but reports he cannot take a deep breath.   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   Encounter for general adult medical examination with abnormal findings 06/06/2022   Popliteal artery aneurysm (HCC) 07/04/2021   Essential hypertension 07/04/2021   Bilateral inguinal hernia without obstruction or gangrene 03/08/2020   Abnormality of lung on CXR 12/26/2017   COPD (chronic obstructive pulmonary disease) (HCC) 12/25/2017   CAD in native artery 11/02/2016   Tobacco abuse 10/30/2016   History of colonic polyps 10/30/2016  AAA (abdominal aortic aneurysm) without rupture (HCC) 10/30/2016   Hyperlipidemia 10/30/2016   Immunization refused 10/30/2016    Allergies:  Allergies  Allergen Reactions   Iodine Hives   Medications:  Current Outpatient Medications:    azithromycin (ZITHROMAX) 250 MG tablet, Take 2 tablets on day 1, then 1 tablet daily on days 2 through 5, Disp: 6 tablet, Rfl: 0   benzonatate (TESSALON) 100 MG capsule,  Take 1 capsule (100 mg total) by mouth 3 (three) times daily as needed., Disp: 20 capsule, Rfl: 0   predniSONE (DELTASONE) 20 MG tablet, Take 2 tablets (40 mg total) by mouth daily with breakfast for 5 days., Disp: 10 tablet, Rfl: 0   albuterol (VENTOLIN HFA) 108 (90 Base) MCG/ACT inhaler, Inhale 2 puffs into the lungs every 6 (six) hours as needed for wheezing or shortness of breath., Disp: 18 g, Rfl: 5   ANORO ELLIPTA 62.5-25 MCG/ACT AEPB, USE 1 INHALATION BY MOUTH DAILY  AT 6 AM, Disp: 180 each, Rfl: 3   aspirin EC 81 MG tablet, Take 81 mg by mouth every evening., Disp: , Rfl:    atorvastatin (LIPITOR) 40 MG tablet, TAKE 1 TABLET BY MOUTH DAILY (Patient taking differently: Take 40 mg by mouth every evening.), Disp: 90 tablet, Rfl: 3   Cholecalciferol (VITAMIN D-3) 125 MCG (5000 UT) TABS, Take 5,000 Units by mouth every evening., Disp: , Rfl:    losartan (COZAAR) 25 MG tablet, Take 1 tablet (25 mg total) by mouth daily., Disp: 100 tablet, Rfl: 3   ondansetron (ZOFRAN) 4 MG tablet, Take 1 tablet (4 mg total) by mouth every 8 (eight) hours as needed., Disp: 30 tablet, Rfl: 1   sennosides-docusate sodium (SENOKOT-S) 8.6-50 MG tablet, Take 1-2 tablets by mouth daily as needed for constipation., Disp: , Rfl:   Observations/Objective: Patient is well-developed, well-nourished in no acute distress.  Resting comfortably at home.  Head is normocephalic, atraumatic.  No labored breathing.  Speech is clear and coherent with logical content.  Patient is alert and oriented at baseline.    Assessment and Plan: 1. COPD exacerbation (HCC) (Primary)  2. Viral upper respiratory tract infection  At this time pt afebrile, speaking in complete sentences, in no acute distress during face to face visit.  Will treat COPD exacerbation with antibiotic, prednisone course, and tessalon for cough.  ED precautions given.   Follow Up Instructions: I discussed the assessment and treatment plan with the patient. The  patient was provided an opportunity to ask questions and all were answered. The patient agreed with the plan and demonstrated an understanding of the instructions.  A copy of instructions were sent to the patient via MyChart unless otherwise noted below.     The patient was advised to call back or seek an in-person evaluation if the symptoms worsen or if the condition fails to improve as anticipated.    Tylene Fantasia Ward, PA-C

## 2024-01-07 ENCOUNTER — Other Ambulatory Visit: Payer: Self-pay | Admitting: Internal Medicine

## 2024-01-07 DIAGNOSIS — J449 Chronic obstructive pulmonary disease, unspecified: Secondary | ICD-10-CM

## 2024-01-08 ENCOUNTER — Encounter: Payer: Self-pay | Admitting: Internal Medicine

## 2024-01-09 ENCOUNTER — Telehealth: Payer: Self-pay | Admitting: Internal Medicine

## 2024-01-10 NOTE — Telephone Encounter (Signed)
 Spoke to patient let him know Dr. Allena Katz is out of the office, said he was seen by another provider that recommended his f/u with his pcp for at home oxygen, put him on for an appt on 01/16/24, advised him if sx worsen go to ED. Verbalized understanding.

## 2024-01-10 NOTE — Telephone Encounter (Signed)
 Copied from CRM [id number]. Topic: Clinical - Medication Question >> Jan 09, 2024  2:47 PM Nyra Capes wrote: Reason for CRM: Marchelle Folks calling from [facility] phone # [phone]    Requesting these forms Face to Face notes with diagnosis codes  Signed standard written order from Dr.Patel  Oxygen 3 part  test results.  Please fax form to [phone]

## 2024-01-10 NOTE — Telephone Encounter (Signed)
 Pt schedule on 01/16/24 for evaluation.

## 2024-01-16 ENCOUNTER — Ambulatory Visit: Admitting: Internal Medicine

## 2024-01-16 ENCOUNTER — Encounter: Payer: Self-pay | Admitting: Internal Medicine

## 2024-01-16 VITALS — BP 126/68 | HR 78 | Ht 69.0 in | Wt 170.2 lb

## 2024-01-16 DIAGNOSIS — J9601 Acute respiratory failure with hypoxia: Secondary | ICD-10-CM | POA: Diagnosis not present

## 2024-01-16 DIAGNOSIS — J441 Chronic obstructive pulmonary disease with (acute) exacerbation: Secondary | ICD-10-CM | POA: Diagnosis not present

## 2024-01-16 DIAGNOSIS — R0609 Other forms of dyspnea: Secondary | ICD-10-CM

## 2024-01-16 NOTE — Progress Notes (Unsigned)
 Established Patient Office Visit  Subjective:  Patient ID: David Rodriguez, male    DOB: [birth year]  Age: 78 y.o. MRN: [id number]  CC:  Chief Complaint  Patient presents with   Shortness of Breath    Pt reports sx of sob, wants oxygen for home use.     HPI David Rodriguez is a 78 y.o. male with past medical history of CAD, HTN, COPD and tobacco abuse who presents for f/u of recent COPD exacerbation.  He had episodes of cough, shortness of breath and wheezing about 2 weeks ago. He had an e-visit, was prescribed azithromycin, oral prednisone and Tessalon Perles.  He has noticed improvement in cough and dyspnea, but still has exertional dyspnea.  His oxygen levels were in lower 80s without exertion at home when he had acute exacerbation of COPD. Today, his O2 sat was 91% on room air while sitting, but had decreased up to 87% upon ambulation.  He currently uses Anoro as maintenance inhaler and as needed albuterol for dyspnea or wheezing.  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   Hyperlipidemia    Hypertension    Screening for lung cancer 12/12/2022   Tobacco abuse 10/30/2016    Past Surgical History:  Procedure Laterality Date   ABDOMINAL AORTIC ANEURYSM REPAIR  2013   CARDIAC CATHETERIZATION  2017   TONSILLECTOMY AND ADENOIDECTOMY  1953    Family History  Problem Relation Age of Onset   Hypertension Mother    Cancer Father        MESOTHELIOMA   Cancer Sister        LUNG   Cancer Brother        LUNG    Social History   Socioeconomic History   Marital status: Widowed    Spouse name: [name]   Number of children: 3   Years of education: 13   Highest education level: Some college, no degree  Occupational History   Occupation: RETIRED    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  Tobacco Use   Smoking status: Former    Current packs/day: 0.00    Average packs/day: 0.5 packs/day for 50.0 years  (25.0 ttl pk-yrs)    Types: Cigarettes    Start date: 08/27/1967    Quit date: 08/26/2017    Years since quitting: 6.3    Passive exposure: Never   Smokeless tobacco: Former   Tobacco comments:    Quit the first week of march 2025  Vaping Use   Vaping status: Never Used  Substance and Sexual Activity   Alcohol use: No   Drug use: No   Sexual activity: Not Currently  Other Topics Concern   Not on file  Social History Narrative   Moved from [place]   Retired Music therapist   Lives with [employer]   Social Drivers of Health   Financial Resource Strain: Low Risk  (12/08/2023)   Overall Financial Resource Strain (CARDIA)    Difficulty of Paying Living Expenses: Not hard at all  Food Insecurity: No Food Insecurity (12/08/2023)   Hunger Vital Sign    Worried About Running Out of Food in the Last Year: Never true    Ran Out of Food in the Last Year: Never true  Transportation Needs: No Transportation Needs (12/08/2023)   PRAPARE - Administrator, Civil Service (Medical): No    Lack of Transportation (Non-Medical): No  Physical Activity: Insufficiently Active (12/08/2023)   Exercise Vital Sign    Days of Exercise per Week: 3 days    Minutes of Exercise per Session: 30 min  Stress: No Stress Concern Present (12/08/2023)   [employer] of Occupational Health - Occupational Stress Questionnaire    Feeling of Stress : Only a little  Social Connections: Socially Isolated (12/08/2023)   Social Connection and Isolation Panel [NHANES]    Frequency of Communication with Friends and Family: More than three times a week    Frequency of Social Gatherings with Friends and Family: Three times a week    Attends Religious Services: Never    Active Member of Clubs or Organizations: No    Attends Banker Meetings: Never    Marital Status: Widowed  Intimate Partner Violence: Not At Risk (05/13/2023)   Humiliation, Afraid, Rape, and Kick questionnaire    Fear of Current or  Ex-Partner: No    Emotionally Abused: No    Physically Abused: No    Sexually Abused: No    Outpatient Medications Prior to Visit  Medication Sig Dispense Refill   albuterol (VENTOLIN HFA) 108 (90 Base) MCG/ACT inhaler INHALE 1 TO 2 PUFFS EVERY 6  HOURS AS NEEDED FOR SHORTNESS OF BREATH OR WHEEZING 17 g 6   ANORO ELLIPTA 62.5-25 MCG/ACT AEPB USE 1 INHALATION BY MOUTH DAILY  AT 6 AM 180 each 3   aspirin EC 81 MG tablet Take 81 mg by mouth every evening.     atorvastatin (LIPITOR) 40 MG tablet TAKE 1 TABLET BY MOUTH DAILY (Patient taking differently: Take 40 mg by mouth every evening.) 90 tablet 3   benzonatate (TESSALON) 100 MG capsule Take 1 capsule (100 mg total) by mouth 3 (three) times daily as needed. 20 capsule 0   Cholecalciferol (VITAMIN D-3) 125 MCG (5000 UT) TABS Take 5,000 Units by mouth every evening.     losartan (COZAAR) 25 MG tablet Take 1 tablet (25 mg total) by mouth daily. 100 tablet 3   ondansetron (ZOFRAN) 4 MG tablet Take 1 tablet (4 mg total) by mouth every 8 (eight) hours as needed. 30 tablet 1   sennosides-docusate sodium (SENOKOT-S) 8.6-50 MG tablet Take 1-2 tablets by mouth daily as needed for constipation.     No facility-administered medications prior to visit.    Allergies  Allergen Reactions   Iodine Hives    ROS Review of Systems  Constitutional:  Negative for chills and fever.  HENT:  Negative for congestion and sore throat.   Eyes:  Negative for pain and discharge.  Respiratory:  Positive for cough and shortness of breath.   Cardiovascular:  Negative for chest pain and palpitations.  Gastrointestinal:  Positive for constipation. Negative for diarrhea, nausea and vomiting.  Endocrine: Negative for polydipsia and polyuria.  Genitourinary:  Negative for dysuria and hematuria.  Musculoskeletal:  Negative for neck pain and neck stiffness.  Skin:  Negative for rash.  Neurological:  Negative for dizziness, weakness, numbness and headaches.   Psychiatric/Behavioral:  Negative for agitation and behavioral problems.       Objective:    Physical Exam Vitals reviewed.  Constitutional:      General: He is not in acute distress.    Appearance: He is not diaphoretic.  HENT:     Head: Normocephalic and atraumatic.     Nose: Nose normal.     Mouth/Throat:     Mouth: Mucous membranes are moist.  Eyes:     General: No  scleral icterus.    Extraocular Movements: Extraocular movements intact.  Cardiovascular:     Rate and Rhythm: Normal rate and regular rhythm.     Heart sounds: Normal heart sounds. No murmur heard. Pulmonary:     Breath sounds: Normal breath sounds. No wheezing or rales.  Musculoskeletal:     Cervical back: Neck supple. No tenderness.     Right lower leg: No edema.     Left lower leg: No edema.  Skin:    General: Skin is warm.     Findings: No rash.  Neurological:     General: No focal deficit present.     Mental Status: He is alert and oriented to person, place, and time.     Sensory: No sensory deficit.     Motor: No weakness.  Psychiatric:        Mood and Affect: Mood normal.        Behavior: Behavior normal.     BP 126/68 (BP Location: Right Arm)   Pulse 78   Ht 5\' 9"  (1.753 m)   Wt 170 lb 3.2 oz (77.2 kg)   SpO2 91%   BMI 25.13 kg/m  Wt Readings from Last 3 Encounters:  01/16/24 170 lb 3.2 oz (77.2 kg)  12/12/23 173 lb 6.4 oz (78.7 kg)  09/18/23 165 lb (74.8 kg)    Lab Results  Component Value Date   TSH 1.260 12/09/2023   Lab Results  Component Value Date   WBC 6.7 12/09/2023   HGB 15.8 12/09/2023   HCT 47.2 12/09/2023   MCV 91 12/09/2023   PLT 279 12/09/2023   Lab Results  Component Value Date   NA 137 12/09/2023   K 4.6 12/09/2023   CO2 24 12/09/2023   GLUCOSE 86 12/09/2023   BUN 11 12/09/2023   CREATININE 0.89 12/09/2023   BILITOT 0.8 12/09/2023   ALKPHOS 83 12/09/2023   AST 14 12/09/2023   ALT 10 12/09/2023   PROT 6.4 12/09/2023   ALBUMIN 4.2 12/09/2023    CALCIUM 9.1 12/09/2023   EGFR 87 12/09/2023   Lab Results  Component Value Date   CHOL 150 12/09/2023   Lab Results  Component Value Date   HDL 47 12/09/2023   Lab Results  Component Value Date   LDLCALC 85 12/09/2023   Lab Results  Component Value Date   TRIG 99 12/09/2023   Lab Results  Component Value Date   CHOLHDL 3.2 12/09/2023   Lab Results  Component Value Date   HGBA1C 6.0 (H) 12/09/2023      Assessment & Plan:   Problem List Items Addressed This Visit       Respiratory   COPD (chronic obstructive pulmonary disease) (HCC) - Primary   Had recent COPD exacerbation, treated with oral prednisone and azithromycin Usually has been well-controlled with Anoro and PRN albuterol If recurrent COPD exacerbation, will switch to Trelegy Awaiting recent CT chest for pulmonary nodule follow up      Acute hypoxic respiratory failure (HCC)   Likely due to recent COPD exacerbation O2 sats: At rest on room air: 91% Upon ambulation on room air: 87% Upon ambulation with 2 LPM O2: 92%  Had oxygen desaturations during recent COPD exacerbation at home in lower 80s Would benefit from home oxygen for as needed use - O2 prescription sent      Relevant Orders   For home use only DME oxygen     Other   Exertional dyspnea   Checked echo-LVEF 45-50%  with global wall hypokinesis of LV Denies orthopnea or PND Mostly exertional dyspnea, also has history of COPD Reports feeling better when he does not smoke If worsening of dyspnea, will need cardiac workup      Relevant Orders   For home use only DME oxygen    No orders of the defined types were placed in this encounter.   Follow-up: Return if symptoms worsen or fail to improve.    Anabel Halon, MD

## 2024-01-16 NOTE — Patient Instructions (Addendum)
 Please continue using maintenance inhaler regularly and use Albuterol as needed for shortness of breath or wheezing.  Please use home oxygen if you have shortness of breath or hypoxia (oxygen saturation less than 89%).

## 2024-01-16 NOTE — Assessment & Plan Note (Addendum)
 Had recent COPD exacerbation, treated with oral prednisone and azithromycin Usually has been well-controlled with Anoro and PRN albuterol If recurrent COPD exacerbation, will switch to Trelegy Awaiting recent CT chest for pulmonary nodule follow up

## 2024-01-16 NOTE — Assessment & Plan Note (Signed)
 Likely due to recent COPD exacerbation O2 sats: At rest on room air: 91% Upon ambulation on room air: 87% Upon ambulation with 2 LPM O2: 92%  Had oxygen desaturations during recent COPD exacerbation at home in lower 80s Would benefit from home oxygen for as needed use - O2 prescription sent

## 2024-01-16 NOTE — Assessment & Plan Note (Signed)
 Checked echo-LVEF 45-50% with global wall hypokinesis of LV Denies orthopnea or PND Mostly exertional dyspnea, also has history of COPD Reports feeling better when he does not smoke If worsening of dyspnea, will need cardiac workup

## 2024-01-20 ENCOUNTER — Encounter: Payer: Self-pay | Admitting: Internal Medicine

## 2024-01-20 ENCOUNTER — Other Ambulatory Visit: Payer: Self-pay | Admitting: Internal Medicine

## 2024-01-20 DIAGNOSIS — I5020 Unspecified systolic (congestive) heart failure: Secondary | ICD-10-CM | POA: Insufficient documentation

## 2024-01-20 DIAGNOSIS — I2510 Atherosclerotic heart disease of native coronary artery without angina pectoris: Secondary | ICD-10-CM

## 2024-01-20 NOTE — Telephone Encounter (Signed)
 Results have been reviewed and sent to pt.

## 2024-01-28 ENCOUNTER — Encounter: Payer: Self-pay | Admitting: Internal Medicine

## 2024-01-30 ENCOUNTER — Telehealth: Admitting: Physician Assistant

## 2024-01-30 DIAGNOSIS — M545 Low back pain, unspecified: Secondary | ICD-10-CM

## 2024-01-30 NOTE — Progress Notes (Signed)
  Because of severity of pain and need for exam, I feel your condition warrants further evaluation and I recommend that you be seen in a face-to-face visit.   NOTE: There will be NO CHARGE for this E-Visit   If you are having a true medical emergency, please call 911.     For an urgent face to face visit, [facility] has multiple urgent care centers for your convenience.  Click the link below for the full list of locations and hours, walk-in wait times, appointment scheduling options and driving directions:  Urgent Care - [place], [place], [place], [place], [place], [place]  [place]     Your MyChart E-visit questionnaire answers were reviewed by a board certified advanced clinical practitioner to complete your personal care plan based on your specific symptoms.    Thank you for using e-Visits.

## 2024-01-30 NOTE — Addendum Note (Signed)
 Addended byCleola Dach on: 01/30/2024 08:13 AM   Modules accepted: Orders

## 2024-02-26 ENCOUNTER — Ambulatory Visit: Attending: Internal Medicine | Admitting: Internal Medicine

## 2024-02-26 VITALS — BP 112/70 | HR 73 | Ht 69.0 in | Wt 181.0 lb

## 2024-02-26 DIAGNOSIS — Z9889 Other specified postprocedural states: Secondary | ICD-10-CM

## 2024-02-26 DIAGNOSIS — I358 Other nonrheumatic aortic valve disorders: Secondary | ICD-10-CM

## 2024-02-26 DIAGNOSIS — I5020 Unspecified systolic (congestive) heart failure: Secondary | ICD-10-CM

## 2024-02-26 DIAGNOSIS — I5022 Chronic systolic (congestive) heart failure: Secondary | ICD-10-CM | POA: Diagnosis not present

## 2024-02-26 MED ORDER — METOPROLOL SUCCINATE ER 25 MG PO TB24: 25.0000 mg | ORAL_TABLET | Freq: Every day | ORAL | 3 refills | Status: AC

## 2024-02-26 MED ORDER — PREDNISONE 50 MG PO TABS: ORAL_TABLET | ORAL | 1 refills | Status: DC

## 2024-02-26 MED ORDER — METOPROLOL TARTRATE 100 MG PO TABS: ORAL_TABLET | ORAL | 0 refills | Status: DC

## 2024-02-26 MED ORDER — FUROSEMIDE 20 MG PO TABS
20.0000 mg | ORAL_TABLET | Freq: Every day | ORAL | 5 refills | Status: AC | PRN
Start: 2024-02-26 — End: ?

## 2024-02-26 MED ORDER — DIPHENHYDRAMINE HCL 50 MG PO TABS: ORAL_TABLET | ORAL | 0 refills | Status: DC

## 2024-02-26 NOTE — Progress Notes (Signed)
 Cardiology Office Note  Date: 02/26/2024   ID: David Rodriguez, DOB [birth year], MRN [id number]  PCP:  Meldon Sport, MD  Cardiologist:  Lasalle Pointer, MD Electrophysiologist:  None   History of Present Illness: David Rodriguez is a 78 y.o. male known to have chronic systolic heart failure, AAA repair in 2011 in [place] , COPD, right renal artery aneurysm, HTN, HLD was referred to cardiology clinic to establish care and for management of chronic systolic heart failure.  Ongoing DOE for the last 1 year, improved after quitting smoking 2 months ago.  He also has history of COPD.  He does not have new orthopnea or PND.  No leg swelling.  No angina, DOE or syncope.  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   Hyperlipidemia    Hypertension    Screening for lung cancer 12/12/2022   Tobacco abuse 10/30/2016    Past Surgical History:  Procedure Laterality Date   ABDOMINAL AORTIC ANEURYSM REPAIR  2013   CARDIAC CATHETERIZATION  2017   TONSILLECTOMY AND ADENOIDECTOMY  1953    Current Outpatient Medications  Medication Sig Dispense Refill   albuterol  (VENTOLIN  HFA) 108 (90 Base) MCG/ACT inhaler INHALE 1 TO 2 PUFFS EVERY 6  HOURS AS NEEDED FOR SHORTNESS OF BREATH OR WHEEZING 17 g 6   ANORO ELLIPTA  62.5-25 MCG/ACT AEPB USE 1 INHALATION BY MOUTH DAILY  AT 6 AM 180 each 3   aspirin EC 81 MG tablet Take 81 mg by mouth every evening.     atorvastatin  (LIPITOR) 40 MG tablet TAKE 1 TABLET BY MOUTH DAILY (Patient taking differently: Take 40 mg by mouth every evening.) 90 tablet 3   benzonatate  (TESSALON ) 100 MG capsule Take 1 capsule (100 mg total) by mouth 3 (three) times daily as needed. 20 capsule 0   Cholecalciferol (VITAMIN D -3) 125 MCG (5000 UT) TABS Take 5,000 Units by mouth every evening.     losartan  (COZAAR ) 25 MG tablet Take 1 tablet (25 mg total) by mouth daily. 100 tablet 3    ondansetron  (ZOFRAN ) 4 MG tablet Take 1 tablet (4 mg total) by mouth every 8 (eight) hours as needed. 30 tablet 1   sennosides-docusate sodium (SENOKOT-S) 8.6-50 MG tablet Take 1-2 tablets by mouth daily as needed for constipation.     No current facility-administered medications for this visit.   Allergies:  Iodine   Social History: The patient  reports that he quit smoking about 6 years ago. His smoking use included cigarettes. He started smoking about 56 years ago. He has a 25 pack-year smoking history. He has never been exposed to tobacco smoke. He has quit using smokeless tobacco. He reports that he does not drink alcohol and does not use drugs.   Family History: The patient's family history includes Cancer in his brother, father, and sister; Hypertension in his mother.   ROS:  Please see the history of present illness. Otherwise, complete review of systems is positive for none.  All other systems are reviewed and negative.   Physical Exam: VS:  Ht 5\' 9"  (1.753 m)   Wt 181 lb (82.1 kg)   BMI 26.73 kg/m , BMI Body mass index is 26.73 kg/m.  Wt Readings from Last 3 Encounters:  02/26/24 181 lb (82.1 kg)  01/16/24 170 lb 3.2 oz (77.2 kg)  12/12/23 173 lb 6.4 oz (78.7 kg)  General: Patient appears comfortable at rest. HEENT: Conjunctiva and lids normal, oropharynx clear with moist mucosa. Neck: Supple, no elevated JVP or carotid bruits, no thyromegaly. Lungs: Clear to auscultation, nonlabored breathing at rest. Cardiac: Regular rate and rhythm, no S3 or significant systolic murmur, no pericardial rub. Abdomen: Soft, nontender, no hepatomegaly, bowel sounds present, no guarding or rebound. Extremities: No pitting edema, distal pulses 2+. Skin: Warm and dry. Musculoskeletal: No kyphosis. Neuropsychiatric: Alert and oriented x3, affect grossly appropriate.  Recent Labwork: 12/09/2023: ALT 10; AST 14; BUN 11; Creatinine, Ser 0.89; Hemoglobin 15.8; Platelets 279; Potassium 4.6;  Sodium 137; TSH 1.260     Component Value Date/Time   CHOL 150 12/09/2023 0810   TRIG 99 12/09/2023 0810   HDL 47 12/09/2023 0810   CHOLHDL 3.2 12/09/2023 0810   CHOLHDL 2.8 03/29/2021 0819   VLDL 17 06/03/2017 0804   LDLCALC 85 12/09/2023 0810   LDLCALC 82 03/29/2021 0819     Assessment and Plan:  Chronic systolic heart failure: Ongoing DOE for the last 1 year, has COPD and after quitting smoking 2 months ago, his SOB significantly improved.  No angina or leg swelling.  He sleeps with 3 pillows, this is a habit and not new.  Start metoprolol succinate 50 mg once daily and continue losartan  20 mg once daily.  Take p.o. Lasix 20 mg as needed for leg swelling/weight gain/DOE.  He will return back to cardiology clinic in 6 weeks with BP log so GDMT can be uptitrated.  Echocardiogram in September 2024 showed LVEF 45 to 50%.  EKG shows left bundle branch block, old.  Obtain CT cardiac for ischemia evaluation.  Will update echocardiogram, limited in the interim.  He has contrast allergy, will provide steroid regimen.  AAA repair in 2011 in [place] : Follows with vascular surgery.  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  HTN, controlled: Home BPs range around 130 to 140 mmHg SBP according to the patient.  Continue losartan  25 mg once daily and added metoprolol as stated above.  HLD, at goal: Continue atorvastatin  40 mg nightly.  Goal LDL is less than 100.    Medication Adjustments/Labs and Tests Ordered: Current medicines are reviewed at length with the patient today.  Concerns regarding medicines are outlined above.    Disposition:  Follow up 6 weeks  Signed, Amorita Vanrossum Beauford Bounds, MD, 02/26/2024 2:49 PM    [facility] [address], [place], [place] [address]

## 2024-02-26 NOTE — Patient Instructions (Addendum)
 Medication Instructions:  Your physician has recommended you make the following change in your medication:   -Start Toprol XL 25 mg once daily  -Start Lasix 20 mg as needed for leg swelling, shortness of breath, and weight gain  *If you need a refill on your cardiac medications before your next appointment, please call your pharmacy*  Lab Work: BMET prior to your CT scan  If you have labs (blood work) drawn today and your tests are completely normal, you will receive your results only by: MyChart Message (if you have MyChart) OR A paper copy in the mail If you have any lab test that is abnormal or we need to change your treatment, we will call you to review the results.  Testing/Procedures: Your physician has requested that you have an echocardiogram. Echocardiography is a painless test that uses sound waves to create images of your heart. It provides your doctor with information about the size and shape of your heart and how well your heart's chambers and valves are working. This procedure takes approximately one hour. There are no restrictions for this procedure. Please do NOT wear cologne, perfume, aftershave, or lotions (deodorant is allowed). Please arrive 15 minutes prior to your appointment time.  Please note: We ask at that you not bring children with you during ultrasound (echo/ vascular) testing. Due to room size and safety concerns, children are not allowed in the ultrasound rooms during exams. Our front office staff cannot provide observation of children in our lobby area while testing is being conducted. An adult accompanying a patient to their appointment will only be allowed in the ultrasound room at the discretion of the ultrasound technician under special circumstances. We apologize for any inconvenience.  Your physician has requested that you have cardiac CT. Cardiac computed tomography (CT) is a painless test that uses an x-ray machine to take clear, detailed pictures of  your heart. For further information please visit [email or web address] Please follow instruction sheet as given.    Follow-Up: At [facility], you and your health needs are our priority.  As part of our continuing mission to provide you with exceptional heart care, our providers are all part of one team.  This team includes your primary Cardiologist (physician) and Advanced Practice Providers or APPs (Physician Assistants and Nurse Practitioners) who all work together to provide you with the care you need, when you need it.  Your next appointment:   6 week(s)  Provider:   You may see Vishnu P Mallipeddi, MD or one of the following Advanced Practice Providers on your designated Care Team:   [place], PA-C  [place], [place] Theotis Flake, [place]     We recommend signing up for the patient portal called "MyChart".  Sign up information is provided on this After Visit Summary.  MyChart is used to connect with patients for Virtual Visits (Telemedicine).  Patients are able to view lab/test results, encounter notes, upcoming appointments, etc.  Non-urgent messages can be sent to your provider as well.   To learn more about what you can do with MyChart, go to [email or web address].   Other Instructions Your physician has requested that you regularly monitor and record your blood pressure readings at home. Please use the same machine at the same time of day to check your readings and record them to bring to your follow-up visit.     Your cardiac CT will be scheduled at the below location:   [facility] [address]  [address]  [place], [place] [address] Opening February 10, 2024   If scheduled at the [facility] at [facility], please enter the parking lot using the [address] street entrance and use the FREE valet service at the patient drop-off area. Enter the buidling and check-in with registration on the main floor.   Please follow  these instructions carefully (unless otherwise directed):  An IV will be required for this test and Nitroglycerin will be given.  Hold all erectile dysfunction medications at least 3 days (72 hrs) prior to test. (Ie viagra, cialis, sildenafil, tadalafil, etc)   On the Night Before the Test: Be sure to Drink plenty of water . Do not consume any caffeinated/decaffeinated beverages or chocolate 12 hours prior to your test. Do not take any antihistamines 12 hours prior to your test. If the patient has contrast allergy: Patient will need a prescription for Prednisone  and very clear instructions (as follows): Prednisone  50 mg - take 13 hours prior to test Take another Prednisone  50 mg 7 hours prior to test Take another Prednisone  50 mg 1 hour prior to test Take Benadryl 50 mg 1 hour prior to test Patient must complete all four doses of above prophylactic medications. Patient will need a ride after test due to Benadryl.  On the Day of the Test: Drink plenty of water  until 1 hour prior to the test. Do not eat any food 1 hour prior to test. You may take your regular medications prior to the test.  Take metoprolol (Lopressor) two hours prior to test. HOLD LOSARTAN  THE DAY OF YOUR PROCEDURE.  HOLD TOPROL XL THE DAY OF YOUR PROCEDURE If you take Furosemide/Hydrochlorothiazide/Spironolactone/Chlorthalidone, please HOLD on the morning of the test. Patients who wear a continuous glucose monitor MUST remove the device prior to scanning.       After the Test: Drink plenty of water . After receiving IV contrast, you may experience a mild flushed feeling. This is normal. On occasion, you may experience a mild rash up to 24 hours after the test. This is not dangerous. If this occurs, you can take Benadryl 25 mg, Zyrtec, Claritin, or Allegra and increase your fluid intake. (Patients taking Tikosyn should avoid Benadryl, and may take Zyrtec, Claritin, or Allegra) If you experience trouble breathing, this  can be serious. If it is severe call 911 IMMEDIATELY. If it is mild, please call our office.  We will call to schedule your test 2-4 weeks out understanding that some insurance companies will need an authorization prior to the service being performed.   For more information and frequently asked questions, please visit our website : [email or web address]  For non-scheduling related questions, please contact the cardiac imaging nurse navigator should you have any questions/concerns: Cardiac Imaging Nurse Navigators Direct Office Dial: [phone]   For scheduling needs, including cancellations and rescheduling, please call [place], [phone].

## 2024-02-27 ENCOUNTER — Telehealth: Payer: Self-pay | Admitting: Internal Medicine

## 2024-02-27 NOTE — Telephone Encounter (Signed)
 Spoke with pt and informed pt that Toprol and Lasix are in addition to his current medicines.

## 2024-02-27 NOTE — Telephone Encounter (Signed)
 Pt requesting a cb to discuss if new meds are replacing any of his current meds or an addition.

## 2024-03-02 DIAGNOSIS — I5022 Chronic systolic (congestive) heart failure: Secondary | ICD-10-CM | POA: Diagnosis not present

## 2024-03-02 NOTE — Addendum Note (Signed)
 Addended by: Sharicka Pogorzelski A on: 03/02/2024 08:33 AM   Modules accepted: Orders

## 2024-03-03 ENCOUNTER — Other Ambulatory Visit: Payer: Self-pay | Admitting: *Deleted

## 2024-03-03 LAB — BASIC METABOLIC PANEL WITH GFR
BUN/Creatinine Ratio: 12 (ref 10–24)
BUN: 11 mg/dL (ref 8–27)
CO2: 21 mmol/L (ref 20–29)
Calcium: 9.5 mg/dL (ref 8.6–10.2)
Chloride: 106 mmol/L (ref 96–106)
Creatinine, Ser: 0.89 mg/dL (ref 0.76–1.27)
Glucose: 85 mg/dL (ref 70–99)
Potassium: 4.4 mmol/L (ref 3.5–5.2)
Sodium: 143 mmol/L (ref 134–144)
eGFR: 87 mL/min/{1.73_m2} (ref >59)

## 2024-03-03 NOTE — Progress Notes (Signed)
US orders placed.

## 2024-03-05 ENCOUNTER — Ambulatory Visit: Payer: Self-pay | Admitting: Internal Medicine

## 2024-03-06 ENCOUNTER — Ambulatory Visit (HOSPITAL_COMMUNITY)
Admission: RE | Admit: 2024-03-06 | Discharge: 2024-03-06 | Disposition: A | Discharge status: Home / Self Care | Source: Ambulatory Visit | Attending: Internal Medicine | Admitting: Internal Medicine

## 2024-03-06 ENCOUNTER — Other Ambulatory Visit: Payer: Self-pay | Admitting: Internal Medicine

## 2024-03-06 DIAGNOSIS — Z9889 Other specified postprocedural states: Secondary | ICD-10-CM

## 2024-03-06 DIAGNOSIS — I5022 Chronic systolic (congestive) heart failure: Secondary | ICD-10-CM | POA: Diagnosis not present

## 2024-03-06 DIAGNOSIS — I5020 Unspecified systolic (congestive) heart failure: Secondary | ICD-10-CM | POA: Insufficient documentation

## 2024-03-06 DIAGNOSIS — I358 Other nonrheumatic aortic valve disorders: Secondary | ICD-10-CM

## 2024-03-06 LAB — ECHOCARDIOGRAM COMPLETE
AR max vel: 2.78 cm2
AV Area VTI: 3 cm2
AV Area mean vel: 2.62 cm2
AV Mean grad: 2.2 mmHg
AV Peak grad: 3.8 mmHg
Ao pk vel: 0.98 m/s
Area-P 1/2: 3.03 cm2
Calc EF: 48.1 %
S' Lateral: 4.3 cm
Single Plane A2C EF: 51.7 %
Single Plane A4C EF: 41.4 %

## 2024-03-11 ENCOUNTER — Ambulatory Visit (HOSPITAL_COMMUNITY): Payer: Medicare Other

## 2024-03-11 ENCOUNTER — Ambulatory Visit (HOSPITAL_COMMUNITY)
Admission: RE | Admit: 2024-03-11 | Discharge: 2024-03-11 | Disposition: A | Discharge status: Home / Self Care | Payer: Medicare Other | Source: Ambulatory Visit | Attending: Vascular Surgery | Admitting: Vascular Surgery

## 2024-03-11 ENCOUNTER — Ambulatory Visit (HOSPITAL_COMMUNITY)
Admission: RE | Admit: 2024-03-11 | Discharge: 2024-03-11 | Discharge status: Home / Self Care | Payer: Medicare Other | Source: Ambulatory Visit | Attending: Vascular Surgery

## 2024-03-11 VITALS — BP 160/76 | HR 70 | Temp 98.9°F | Ht 69.0 in | Wt 174.5 lb

## 2024-03-11 DIAGNOSIS — F17200 Nicotine dependence, unspecified, uncomplicated: Secondary | ICD-10-CM | POA: Diagnosis not present

## 2024-03-11 DIAGNOSIS — Z9889 Other specified postprocedural states: Secondary | ICD-10-CM

## 2024-03-11 DIAGNOSIS — I724 Aneurysm of artery of lower extremity: Secondary | ICD-10-CM

## 2024-03-11 LAB — VAS US ABI WITH/WO TBI
Left ABI: 1.14
Right ABI: 1.18

## 2024-03-11 NOTE — Progress Notes (Signed)
 Office Note     CC:  follow up Requesting Provider:  Meldon Sport, MD  HPI: David Rodriguez is a 78 y.o. (08/27/1946) male who presents for surveillance of endovascular repair of abdominal aortic aneurysm.  This was repaired in [place]  in 2011.  He denies any new or changing abdominal or back pain.  He is also followed for known bilateral popliteal artery aneurysms.  He does not have any claudication, rest pain, or tissue changes of bilateral lower extremities.  He is an everyday smoker.  He is taking a statin daily.  He is also on daily aspirin.   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   Hyperlipidemia    Hypertension    Screening for lung cancer 12/12/2022   Tobacco abuse 10/30/2016    Past Surgical History:  Procedure Laterality Date   ABDOMINAL AORTIC ANEURYSM REPAIR  2013   CARDIAC CATHETERIZATION  2017   TONSILLECTOMY AND ADENOIDECTOMY  1953    Social History   Socioeconomic History   Marital status: Widowed    Spouse name: [name]   Number of children: 3   Years of education: 13   Highest education level: Some college, no degree  Occupational History   Occupation: RETIRED    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  Tobacco Use   Smoking status: Former    Current packs/day: 0.00    Average packs/day: 0.5 packs/day for 50.0 years (25.0 ttl pk-yrs)    Types: Cigarettes    Start date: 08/27/1967    Quit date: 08/26/2017    Years since quitting: 6.5    Passive exposure: Never   Smokeless tobacco: Former   Tobacco comments:    Quit the first week of march 2025  Vaping Use   Vaping status: Never Used  Substance and Sexual Activity   Alcohol use: No   Drug use: No   Sexual activity: Not Currently  Other Topics Concern   Not on file  Social History Narrative   Moved from [place]    Retired Music therapist   Lives with [employer]   Social Drivers of Health   Financial Resource  Strain: Low Risk  (12/08/2023)   Overall Financial Resource Strain (CARDIA)    Difficulty of Paying Living Expenses: Not hard at all  Food Insecurity: No Food Insecurity (12/08/2023)   Hunger Vital Sign    Worried About Running Out of Food in the Last Year: Never true    Ran Out of Food in the Last Year: Never true  Transportation Needs: No Transportation Needs (12/08/2023)   PRAPARE - Transportation    Lack of Transportation (Medical): No    Lack of Transportation (Non-Medical): No  Physical Activity: Insufficiently Active (12/08/2023)   Exercise Vital Sign    Days of Exercise per Week: 3 days    Minutes of Exercise per Session: 30 min  Stress: No Stress Concern Present (12/08/2023)   [employer] of Occupational Health - Occupational Stress Questionnaire    Feeling of Stress : Only a little  Social Connections: Socially Isolated (12/08/2023)   Social Connection and Isolation Panel [NHANES]    Frequency of Communication with Friends and Family: More than three times a week    Frequency of Social Gatherings with Friends and Family: Three times a week    Attends Religious Services: Never    Active Member of Clubs or Organizations: No  Attends Banker Meetings: Never    Marital Status: Widowed  Intimate Partner Violence: Not At Risk (05/13/2023)   Humiliation, Afraid, Rape, and Kick questionnaire    Fear of Current or Ex-Partner: No    Emotionally Abused: No    Physically Abused: No    Sexually Abused: No    Family History  Problem Relation Age of Onset   Hypertension Mother    Cancer Father        MESOTHELIOMA   Cancer Sister        LUNG   Cancer Brother        LUNG    Current Outpatient Medications  Medication Sig Dispense Refill   albuterol  (VENTOLIN  HFA) 108 (90 Base) MCG/ACT inhaler INHALE 1 TO 2 PUFFS EVERY 6  HOURS AS NEEDED FOR SHORTNESS OF BREATH OR WHEEZING 17 g 6   ANORO ELLIPTA  62.5-25 MCG/ACT AEPB USE 1 INHALATION BY MOUTH DAILY  AT 6 AM 180  each 3   aspirin EC 81 MG tablet Take 81 mg by mouth every evening.     atorvastatin  (LIPITOR) 40 MG tablet TAKE 1 TABLET BY MOUTH DAILY (Patient taking differently: Take 40 mg by mouth every evening.) 90 tablet 3   benzonatate  (TESSALON ) 100 MG capsule Take 1 capsule (100 mg total) by mouth 3 (three) times daily as needed. 20 capsule 0   Cholecalciferol (VITAMIN D -3) 125 MCG (5000 UT) TABS Take 5,000 Units by mouth every evening.     diphenhydrAMINE  (BENADRYL ) 50 MG tablet Take 1 tablet 1 hour prior to CT scan. 1 tablet 0   furosemide  (LASIX ) 20 MG tablet Take 1 tablet (20 mg total) by mouth daily as needed for fluid or edema (DOE). 30 tablet 5   losartan  (COZAAR ) 25 MG tablet Take 1 tablet (25 mg total) by mouth daily. 100 tablet 3   metoprolol  succinate (TOPROL  XL) 25 MG 24 hr tablet Take 1 tablet (25 mg total) by mouth daily. 90 tablet 3   metoprolol  tartrate (LOPRESSOR ) 100 MG tablet Take 1 tablet by mouth 2 hours prior to CT. 1 tablet 0   ondansetron  (ZOFRAN ) 4 MG tablet Take 1 tablet (4 mg total) by mouth every 8 (eight) hours as needed. 30 tablet 1   predniSONE  (DELTASONE ) 50 MG tablet Take 1 tablet 13 hours prior to CT scan, THEN take 1 tablet 7 hours prior to CT scan, THEN take 1 tablet 1 hour prior to CT scan. 3 tablet 1   sennosides-docusate sodium (SENOKOT-S) 8.6-50 MG tablet Take 1-2 tablets by mouth daily as needed for constipation.     No current facility-administered medications for this visit.    Allergies  Allergen Reactions   Iodine Hives     REVIEW OF SYSTEMS:   [X]  denotes positive finding, [ ]  denotes negative finding Cardiac  Comments:  Chest pain or chest pressure:    Shortness of breath upon exertion:    Short of breath when lying flat:    Irregular heart rhythm:        Vascular    Pain in calf, thigh, or hip brought on by ambulation:    Pain in feet at night that wakes you up from your sleep:     Blood clot in your veins:    Leg swelling:          Pulmonary    Oxygen at home:    Productive cough:     Wheezing:         Neurologic  Sudden weakness in arms or legs:     Sudden numbness in arms or legs:     Sudden onset of difficulty speaking or slurred speech:    Temporary loss of vision in one eye:     Problems with dizziness:         Gastrointestinal    Blood in stool:     Vomited blood:         Genitourinary    Burning when urinating:     Blood in urine:        Psychiatric    Major depression:         Hematologic    Bleeding problems:    Problems with blood clotting too easily:        Skin    Rashes or ulcers:        Constitutional    Fever or chills:      PHYSICAL EXAMINATION:  Vitals:   03/11/24 0912  BP: (!) 160/76  Pulse: 70  Temp: 98.9 F (37.2 C)  TempSrc: Temporal  SpO2: 90%  Weight: 174 lb 8 oz (79.2 kg)  Height: 5\' 9"  (1.753 m)    General:  WDWN in NAD; vital signs documented above Gait: Not observed HENT: WNL, normocephalic Pulmonary: normal non-labored breathing , without Rales, rhonchi,  wheezing Cardiac: regular HR Abdomen: soft, NT, no masses Skin: without rashes Vascular Exam/Pulses: palpable DP pulses BLE Extremities: without ischemic changes, without Gangrene , without cellulitis; without open wounds;  Musculoskeletal: no muscle wasting or atrophy  Neurologic: A&O X 3 Psychiatric:  The pt has Normal affect.   Non-Invasive Vascular Imaging:   3.8 cm AAA sac with widely patent endograft without endoleak  Right popliteal aneurysm unchanged measuring 1.64 cm Left popliteal measuring 1.2 cm  ABI/TBIToday's ABIToday's TBIPrevious ABIPrevious TBI  +-------+-----------+-----------+------------+------------+  Right 1.18       0.77       1.13        0.63          +-------+-----------+-----------+------------+------------+  Left  1.14       0.86       1.06        0.76          +-------+-----------+-----------+------------+------------+    ASSESSMENT/PLAN:: 78  y.o. male here for follow up for surveillance of EVAR as well as bilateral popliteal artery aneurysms  EVAR duplex demonstrates a widely patent endograft with no change in AAA sac size now measuring 3.8 cm.  No endoleaks detected.  We will repeat EVAR duplex in 1 year.  Popliteal aneurysm is also stable.  Right popliteal measuring 1.7 cm and left popliteal measuring 1.2 cm.  No indication for repair currently.  Continue aspirin and statin daily. Encouraged smoking cessation. Repeat bilateral lower extremity arterial duplex and ABIs in 1 year.  He will follow-up with myself in the [place] office with the above studies.   Cordie Deters, PA-C [facility] [phone]  Clinic MD:   Vikki Graves

## 2024-03-13 ENCOUNTER — Encounter (HOSPITAL_COMMUNITY): Payer: Self-pay

## 2024-03-16 ENCOUNTER — Ambulatory Visit (HOSPITAL_COMMUNITY)
Admission: RE | Admit: 2024-03-16 | Discharge: 2024-03-16 | Disposition: A | Discharge status: Home / Self Care | Source: Ambulatory Visit | Attending: Cardiovascular Disease | Admitting: Cardiovascular Disease

## 2024-03-16 DIAGNOSIS — I5022 Chronic systolic (congestive) heart failure: Secondary | ICD-10-CM | POA: Insufficient documentation

## 2024-03-16 DIAGNOSIS — J439 Emphysema, unspecified: Secondary | ICD-10-CM | POA: Insufficient documentation

## 2024-03-16 DIAGNOSIS — I700 Atherosclerosis of aorta: Secondary | ICD-10-CM | POA: Insufficient documentation

## 2024-03-16 DIAGNOSIS — R918 Other nonspecific abnormal finding of lung field: Secondary | ICD-10-CM | POA: Diagnosis not present

## 2024-03-16 DIAGNOSIS — R931 Abnormal findings on diagnostic imaging of heart and coronary circulation: Secondary | ICD-10-CM | POA: Insufficient documentation

## 2024-03-16 DIAGNOSIS — I2510 Atherosclerotic heart disease of native coronary artery without angina pectoris: Secondary | ICD-10-CM

## 2024-03-16 DIAGNOSIS — I5020 Unspecified systolic (congestive) heart failure: Secondary | ICD-10-CM

## 2024-03-16 MED ORDER — NITROGLYCERIN 0.4 MG SL SUBL
SUBLINGUAL_TABLET | SUBLINGUAL | Status: AC
  Filled 2024-03-16: qty 2

## 2024-03-16 MED ORDER — IOHEXOL 350 MG/ML SOLN
100.0000 mL | Freq: Once | INTRAVENOUS | Status: AC | PRN
  Administered 2024-03-16: 100 mL via INTRAVENOUS

## 2024-03-16 MED ORDER — NITROGLYCERIN 0.4 MG SL SUBL
0.8000 mg | SUBLINGUAL_TABLET | Freq: Once | SUBLINGUAL | Status: AC
  Administered 2024-03-16: 0.8 mg via SUBLINGUAL

## 2024-03-17 ENCOUNTER — Ambulatory Visit (HOSPITAL_BASED_OUTPATIENT_CLINIC_OR_DEPARTMENT_OTHER)
Admission: RE | Admit: 2024-03-17 | Discharge: 2024-03-17 | Disposition: A | Discharge status: Home / Self Care | Source: Ambulatory Visit | Attending: Cardiology | Admitting: Cardiology

## 2024-03-17 ENCOUNTER — Other Ambulatory Visit: Payer: Self-pay | Admitting: Cardiology

## 2024-03-17 DIAGNOSIS — J439 Emphysema, unspecified: Secondary | ICD-10-CM | POA: Diagnosis not present

## 2024-03-17 DIAGNOSIS — I5022 Chronic systolic (congestive) heart failure: Secondary | ICD-10-CM | POA: Diagnosis not present

## 2024-03-17 DIAGNOSIS — R931 Abnormal findings on diagnostic imaging of heart and coronary circulation: Secondary | ICD-10-CM

## 2024-03-17 DIAGNOSIS — I2510 Atherosclerotic heart disease of native coronary artery without angina pectoris: Secondary | ICD-10-CM | POA: Diagnosis not present

## 2024-03-17 DIAGNOSIS — R918 Other nonspecific abnormal finding of lung field: Secondary | ICD-10-CM | POA: Diagnosis not present

## 2024-03-17 DIAGNOSIS — I700 Atherosclerosis of aorta: Secondary | ICD-10-CM | POA: Diagnosis not present

## 2024-03-20 ENCOUNTER — Ambulatory Visit: Payer: Self-pay | Admitting: Internal Medicine

## 2024-03-26 ENCOUNTER — Encounter: Payer: Self-pay | Admitting: Internal Medicine

## 2024-03-26 ENCOUNTER — Ambulatory Visit: Attending: Internal Medicine | Admitting: Internal Medicine

## 2024-03-26 VITALS — BP 114/62 | HR 64 | Ht 69.0 in

## 2024-03-26 DIAGNOSIS — I5022 Chronic systolic (congestive) heart failure: Secondary | ICD-10-CM | POA: Diagnosis not present

## 2024-03-26 DIAGNOSIS — Z79899 Other long term (current) drug therapy: Secondary | ICD-10-CM

## 2024-03-26 MED ORDER — DAPAGLIFLOZIN PROPANEDIOL 10 MG PO TABS: 10.0000 mg | ORAL_TABLET | Freq: Every day | ORAL | 5 refills | Status: AC

## 2024-03-26 MED ORDER — EZETIMIBE 10 MG PO TABS: 10.0000 mg | ORAL_TABLET | Freq: Every day | ORAL | 5 refills | Status: AC

## 2024-03-26 NOTE — Patient Instructions (Signed)
 Medication Instructions:  Your physician has recommended you make the following change in your medication:  Start taking Farxiga 10 mg once daily Start taking Zetia 10 mg once daily Continue taking all other medications as prescribed  Labwork: BMET in one week at [facility] in [place]  Testing/Procedures: None  Follow-Up: Your physician recommends that you schedule a follow-up appointment in:   Any Other Special Instructions Will Be Listed Below (If Applicable).  If you need a refill on your cardiac medications before your next appointment, please call your pharmacy.

## 2024-03-26 NOTE — Progress Notes (Signed)
 Cardiology Office Note  Date: 03/26/2024   ID: David Rodriguez, DOB 1946-04-02, MRN [id number]  PCP:  Meldon Sport, MD  Cardiologist:  Lasalle Pointer, MD Electrophysiologist:  None   History of Present Illness: David Rodriguez is a 78 y.o. male known to have chronic systolic heart failure, AAA repair in 2011 in [place] , COPD, right renal artery aneurysm, HTN, HLD is here for the management of chronic systolic heart failure.  Echocardiogram from May 2025 showed LVEF 40 to 45%, G1 DD with normal LVEDP, normal RV function, no valvular heart disease and CVP 3 mmHg.  CT cardiac from June 2025 showed hemodynamically significant stenosis in the mid to distal LAD and proximal to mid RCA.  Coronary calcium  score is 4101 (94th percentile for age and sex matched control) and total plaque volume is extensive.  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  Annual surveillance imaging is required.  He initially had DOE for 1 year but after quitting smoking few months ago, his DOE significantly improved.  He currently has shortness of breath only with overexertion.  Does not have any angina.  No dizziness, syncope, leg swelling.  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   Hyperlipidemia    Hypertension    Screening for lung cancer 12/12/2022   Tobacco abuse 10/30/2016    Past Surgical History:  Procedure Laterality Date   ABDOMINAL AORTIC ANEURYSM REPAIR  2013   CARDIAC CATHETERIZATION  2017   TONSILLECTOMY AND ADENOIDECTOMY  1953    Current Outpatient Medications  Medication Sig Dispense Refill   albuterol  (VENTOLIN  HFA) 108 (90 Base) MCG/ACT inhaler INHALE 1 TO 2 PUFFS EVERY 6  HOURS AS NEEDED FOR SHORTNESS OF BREATH OR WHEEZING 17 g 6   ANORO ELLIPTA  62.5-25 MCG/ACT AEPB USE 1 INHALATION BY MOUTH DAILY  AT 6 AM 180 each 3   aspirin EC 81  MG tablet Take 81 mg by mouth every evening.     atorvastatin  (LIPITOR) 40 MG tablet TAKE 1 TABLET BY MOUTH DAILY (Patient taking differently: Take 40 mg by mouth every evening.) 90 tablet 3   benzonatate  (TESSALON ) 100 MG capsule Take 1 capsule (100 mg total) by mouth 3 (three) times daily as needed. 20 capsule 0   Cholecalciferol (VITAMIN D -3) 125 MCG (5000 UT) TABS Take 5,000 Units by mouth every evening.     furosemide  (LASIX ) 20 MG tablet Take 1 tablet (20 mg total) by mouth daily as needed for fluid or edema (DOE). 30 tablet 5   losartan  (COZAAR ) 25 MG tablet Take 1 tablet (25 mg total) by mouth daily. 100 tablet 3   metoprolol  succinate (TOPROL  XL) 25 MG 24 hr tablet Take 1 tablet (25 mg total) by mouth daily. 90 tablet 3   sennosides-docusate sodium (SENOKOT-S) 8.6-50 MG tablet Take 1-2 tablets by mouth daily as needed for constipation.     diphenhydrAMINE  (BENADRYL ) 50 MG tablet Take 1 tablet 1 hour prior to CT scan. 1 tablet 0   metoprolol  tartrate (LOPRESSOR ) 100 MG tablet Take 1 tablet by mouth 2 hours prior to CT. 1 tablet 0   ondansetron  (ZOFRAN ) 4 MG tablet Take 1 tablet (4 mg total) by mouth every 8 (eight) hours as needed. 30 tablet 1   predniSONE  (DELTASONE ) 50 MG tablet Take 1 tablet 13 hours  prior to CT scan, THEN take 1 tablet 7 hours prior to CT scan, THEN take 1 tablet 1 hour prior to CT scan. 3 tablet 1   No current facility-administered medications for this visit.   Allergies:  Iodine   Social History: The patient  reports that he quit smoking about 6 years ago. His smoking use included cigarettes. He started smoking about 56 years ago. He has a 25 pack-year smoking history. He has never been exposed to tobacco smoke. He has quit using smokeless tobacco. He reports that he does not drink alcohol and does not use drugs.   Family History: The patient's family history includes Cancer in his brother, father, and sister; Hypertension in his mother.   ROS:  Please see the  history of present illness. Otherwise, complete review of systems is positive for none.  All other systems are reviewed and negative.   Physical Exam: VS:  BP 114/62   Pulse 64   Ht 5' 9 (1.753 m)   SpO2 91%   BMI 25.77 kg/m , BMI Body mass index is 25.77 kg/m.  Wt Readings from Last 3 Encounters:  03/11/24 174 lb 8 oz (79.2 kg)  02/26/24 181 lb (82.1 kg)  01/16/24 170 lb 3.2 oz (77.2 kg)    General: Patient appears comfortable at rest. HEENT: Conjunctiva and lids normal, oropharynx clear with moist mucosa. Neck: Supple, no elevated JVP or carotid bruits, no thyromegaly. Lungs: Clear to auscultation, nonlabored breathing at rest. Cardiac: Regular rate and rhythm, no S3 or significant systolic murmur, no pericardial rub. Abdomen: Soft, nontender, no hepatomegaly, bowel sounds present, no guarding or rebound. Extremities: No pitting edema, distal pulses 2+. Skin: Warm and dry. Musculoskeletal: No kyphosis. Neuropsychiatric: Alert and oriented x3, affect grossly appropriate.  Recent Labwork: 12/09/2023: ALT 10; AST 14; Hemoglobin 15.8; Platelets 279; TSH 1.260 03/02/2024: BUN 11; Creatinine, Ser 0.89; Potassium 4.4; Sodium 143     Component Value Date/Time   CHOL 150 12/09/2023 0810   TRIG 99 12/09/2023 0810   HDL 47 12/09/2023 0810   CHOLHDL 3.2 12/09/2023 0810   CHOLHDL 2.8 03/29/2021 0819   VLDL 17 06/03/2017 0804   LDLCALC 85 12/09/2023 0810   LDLCALC 82 03/29/2021 0819     Assessment and Plan:  Chronic systolic heart failure: DOE improved after stopping smoking, no angina.  Repeat echocardiogram showed LVEF 40 to 45% (previously 45 to 50%).  EKG shows LBBB.  Blood pressure soft, continue metoprolol  succinate 20 mg once daily and losartan  25 mg once daily.  Currently on p.o. Lasix  20 mg daily as needed.  He is compensated.  Start Farxiga 10 mg once daily and obtain BMP in 5 days.    CAD : Asymptomatic except for shortness of breath with overexertion.  No angina. CT  cardiac showed hemodynamically significant stenosis in the mid to distal LAD and proximal to mid RCA, but patient is asymptomatic now.  Will reevaluate his symptoms periodically and schedule him for LHC if symptomatic.  Discussed the symptoms of CAD and MI with the patient.  He verbalized understanding.   AAA repair in 2011 in [place] : Follows with vascular surgery.  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 Repeat imaging annually, he already follows up with vascular surgery.  HTN, controlled: Home Bps controlled, continue p.o. Lasix  20 mg daily as needed, losartan  25 mg once daily and metoprolol  succinate 25 mg once  daily.  HLD, not at goal: Continue atorvastatin  40 mg at bedtime, add Zetia 10 mg once daily.  CT cardiac showed coronary calcium  score of 4101 uncontrolled plaque volume is extensive.  Goal LDL less than 55.  LDL 85 in Feb 2025.  Pulmonary nodule: Follow with PCP.    Medication Adjustments/Labs and Tests Ordered: Current medicines are reviewed at length with the patient today.  Concerns regarding medicines are outlined above.    Disposition:  Follow up 3 months  Signed, Grier Vu Beauford Bounds, MD, 03/26/2024 2:03 PM    [facility] at [facility] [address], [place], [place] [address]

## 2024-03-27 ENCOUNTER — Telehealth: Payer: Self-pay | Admitting: Pharmacy Technician

## 2024-03-27 ENCOUNTER — Other Ambulatory Visit (HOSPITAL_COMMUNITY): Payer: Self-pay

## 2024-03-27 ENCOUNTER — Telehealth: Payer: Self-pay | Admitting: Internal Medicine

## 2024-03-27 NOTE — Telephone Encounter (Signed)
 Pt c/o medication issue:  1. Name of Medication:   dapagliflozin  propanediol (FARXIGA ) 10 MG TABS tablet   2. How are you currently taking this medication (dosage and times per day)? Not currently taking  3. Are you having a reaction (difficulty breathing--STAT)? No  4. What is your medication issue? Pharmacy stated the medication is $500 which he cannot afford. Please advise.

## 2024-03-27 NOTE — Telephone Encounter (Signed)
 Now this is 0.00 with [employer] grant -new encounter made

## 2024-03-27 NOTE — Telephone Encounter (Signed)
   I  called [facility] and verified copay is 500.00 for 30 days due to [employer] deductible.  Patient Advocate Encounter   The patient was approved for a [employer] grant that will help cover the cost of farxiga  Total amount awarded, 4500.  Effective: 02/26/24 - 02/24/25   ZOX:[id number] WUJ:[id number] WGNFA:[id number] [id number] [employer] ID: [id number]   Pharmacy provided with approval and processing information. Patient informed via [facility] and telephone

## 2024-05-18 ENCOUNTER — Ambulatory Visit: Payer: Medicare Other

## 2024-05-18 VITALS — BP 120/80 | Ht 69.0 in | Wt 175.0 lb

## 2024-05-18 DIAGNOSIS — Z0000 Encounter for general adult medical examination without abnormal findings: Secondary | ICD-10-CM

## 2024-05-18 DIAGNOSIS — Z0001 Encounter for general adult medical examination with abnormal findings: Secondary | ICD-10-CM | POA: Diagnosis not present

## 2024-05-18 NOTE — Progress Notes (Signed)
 Subjective:   David Rodriguez is a 78 y.o. who presents for a Medicare Wellness preventive visit.  As a reminder, Annual Wellness Visits don't include a physical exam, and some assessments may be limited, especially if this visit is performed virtually. We may recommend an in-person follow-up visit with your provider if needed.  Visit Complete: Virtual I connected with  [name] on 05/18/24 by a audio enabled telemedicine application and verified that I am speaking with the correct person using two identifiers.  Patient Location: Home  Provider Location: Home Office  I discussed the limitations of evaluation and management by telemedicine. The patient expressed understanding and agreed to proceed.  Vital Signs: Because this visit was a virtual/telehealth visit, some criteria may be missing or patient reported. Any vitals not documented were not able to be obtained and vitals that have been documented are patient reported.  VideoDeclined- This patient declined Librarian, academic. Therefore the visit was completed with audio only.  Persons Participating in Visit: Patient.  AWV Questionnaire: Yes: Patient Medicare AWV questionnaire was completed by the patient on 05/14/2024; I have confirmed that all information answered by patient is correct and no changes since this date.  Cardiac Risk Factors include: advanced age (>36men, >10 women);dyslipidemia;hypertension;male gender;smoking/ tobacco exposure;Other (see comment), Risk factor comments: copd     Objective:    Today's Vitals   05/18/24 1323  BP: 120/80  Weight: 175 lb (79.4 kg)  Height: 5' 9 (1.753 m)   Body mass index is 25.84 kg/m.     05/18/2024    1:24 PM 08/20/2023   10:44 AM 05/13/2023    2:38 PM 04/27/2022    1:37 PM 03/27/2021    8:22 AM  Advanced Directives  Does Patient Have a Medical Advance Directive? Yes No Yes Yes Yes  Type of Estate agent of [place];Living  will  Healthcare Power of [place];Living will  Healthcare Power of [place];Living will  Does patient want to make changes to medical advance directive? No - Patient declined  No - Patient declined No - Patient declined   Copy of Healthcare Power of Attorney in Chart? Yes - validated most recent copy scanned in chart (See row information)  Yes - validated most recent copy scanned in chart (See row information)  No - copy requested  Would patient like information on creating a medical advance directive?  No - Patient declined       Current Medications (verified) Outpatient Encounter Medications as of 05/18/2024  Medication Sig   albuterol  (VENTOLIN  HFA) 108 (90 Base) MCG/ACT inhaler INHALE 1 TO 2 PUFFS EVERY 6  HOURS AS NEEDED FOR SHORTNESS OF BREATH OR WHEEZING   ANORO ELLIPTA  62.5-25 MCG/ACT AEPB USE 1 INHALATION BY MOUTH DAILY  AT 6 AM   aspirin EC 81 MG tablet Take 81 mg by mouth every evening.   atorvastatin  (LIPITOR) 40 MG tablet TAKE 1 TABLET BY MOUTH DAILY (Patient taking differently: Take 40 mg by mouth every evening.)   benzonatate  (TESSALON ) 100 MG capsule Take 1 capsule (100 mg total) by mouth 3 (three) times daily as needed.   Cholecalciferol (VITAMIN D -3) 125 MCG (5000 UT) TABS Take 5,000 Units by mouth every evening.   dapagliflozin  propanediol (FARXIGA ) 10 MG TABS tablet Take 1 tablet (10 mg total) by mouth daily before breakfast.   ezetimibe  (ZETIA ) 10 MG tablet Take 1 tablet (10 mg total) by mouth daily.   furosemide  (LASIX ) 20 MG tablet Take 1 tablet (20 mg  total) by mouth daily as needed for fluid or edema (DOE).   losartan  (COZAAR ) 25 MG tablet Take 1 tablet (25 mg total) by mouth daily.   metoprolol  succinate (TOPROL  XL) 25 MG 24 hr tablet Take 1 tablet (25 mg total) by mouth daily.   metoprolol  tartrate (LOPRESSOR ) 100 MG tablet Take 1 tablet by mouth 2 hours prior to CT.   sennosides-docusate sodium (SENOKOT-S) 8.6-50 MG tablet Take 1-2 tablets by mouth daily as needed for  constipation.   diphenhydrAMINE  (BENADRYL ) 50 MG tablet Take 1 tablet 1 hour prior to CT scan. (Patient not taking: Reported on 05/18/2024)   ondansetron  (ZOFRAN ) 4 MG tablet Take 1 tablet (4 mg total) by mouth every 8 (eight) hours as needed. (Patient not taking: Reported on 05/18/2024)   predniSONE  (DELTASONE ) 50 MG tablet Take 1 tablet 13 hours prior to CT scan, THEN take 1 tablet 7 hours prior to CT scan, THEN take 1 tablet 1 hour prior to CT scan. (Patient not taking: Reported on 05/18/2024)   No facility-administered encounter medications on file as of 05/18/2024.    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   Hyperlipidemia    Hypertension    Screening for lung cancer 12/12/2022   Tobacco abuse 10/30/2016   Past Surgical History:  Procedure Laterality Date   ABDOMINAL AORTIC ANEURYSM REPAIR  2013   ABDOMINAL AORTIC ANEURYSM REPAIR  2013   CARDIAC CATHETERIZATION  2017   TONSILLECTOMY AND ADENOIDECTOMY  1953   Family History  Problem Relation Age of Onset   Hypertension Mother    Cancer Father        MESOTHELIOMA   Cancer Sister        LUNG   Cancer Brother        LUNG   Social History   Socioeconomic History   Marital status: Widowed    Spouse name: [name]   Number of children: 3   Years of education: 13   Highest education level: Some college, no degree  Occupational History   Occupation: RETIRED    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  Tobacco Use   Smoking status: Former    Current packs/day: 0.00    Average packs/day: 0.7 packs/day for 75.0 years (50.0 ttl pk-yrs)    Types: Cigarettes    Start date: 08/27/1967    Quit date: 11/29/2023    Years since quitting: 0.4    Passive exposure: Never   Smokeless tobacco: Former   Tobacco comments:    Quit aftrr 50 plus years of smoking  Vaping Use   Vaping status: Never Used  Substance and Sexual  Activity   Alcohol use: No   Drug use: Not Currently   Sexual activity: Yes  Other Topics Concern   Not on file  Social History Narrative   Moved from [place]    Retired Music therapist   Lives with [employer]   Social Drivers of Health   Financial Resource Strain: Low Risk  (05/18/2024)   Overall Financial Resource Strain (CARDIA)    Difficulty of Paying Living Expenses: Not hard at all  Food Insecurity: No Food Insecurity (05/18/2024)   Hunger Vital Sign    Worried About Running Out of Food in the Last Year: Never true    Ran Out of Food in the Last Year: Never true  Transportation Needs: No Transportation Needs (  05/18/2024)   PRAPARE - Administrator, Civil Service (Medical): No    Lack of Transportation (Non-Medical): No  Physical Activity: Sufficiently Active (05/18/2024)   Exercise Vital Sign    Days of Exercise per Week: 7 days    Minutes of Exercise per Session: 30 min  Stress: No Stress Concern Present (05/18/2024)   [employer] of Occupational Health - Occupational Stress Questionnaire    Feeling of Stress: Not at all  Social Connections: Moderately Isolated (05/18/2024)   Social Connection and Isolation Panel    Frequency of Communication with Friends and Family: More than three times a week    Frequency of Social Gatherings with Friends and Family: More than three times a week    Attends Religious Services: Never    Database administrator or Organizations: Yes    Attends Engineer, structural: More than 4 times per year    Marital Status: Widowed    Tobacco Counseling Counseling given: Yes Tobacco comments: Quit aftrr 50 plus years of smoking    Clinical Intake:  Pre-visit preparation completed: Yes  Pain : No/denies pain     Nutritional Risks: None Diabetes: No  Lab Results  Component Value Date   HGBA1C 6.0 (H) 12/09/2023   HGBA1C 5.9 (H) 12/07/2022   HGBA1C 5.3 02/22/2020     How often do you need to have someone help you when you read  instructions, pamphlets, or other written materials from your doctor or pharmacy?: 1 - Never  Interpreter Needed?: No  Information entered by :: Stefano [facility]   Activities of Daily Living     05/14/2024    7:09 PM 08/22/2023    6:27 AM  In your present state of health, do you have any difficulty performing the following activities:  Hearing? 0 0  Vision? 0 0  Difficulty concentrating or making decisions? 0 0  Walking or climbing stairs? 0   Dressing or bathing? 0   Doing errands, shopping? 0   Preparing Food and eating ? N   Using the Toilet? N   In the past six months, have you accidently leaked urine? N   Do you have problems with loss of bowel control? N   Managing your Medications? N   Managing your Finances? N   Housekeeping or managing your Housekeeping? N     Patient Care Team: Tobie Suzzane POUR, MD as PCP - General (Internal Medicine) Robinson Mayo, OD as Referring Physician (Optometry) Mallipeddi, Diannah SQUIBB, MD as Consulting Physician (Cardiology) Bethanie Donnice RIGGERS (Vascular Surgery)  I have updated your Care Teams any recent Medical Services you may have received from other providers in the past year.     Assessment:   This is a routine wellness examination for [place].  Hearing/Vision screen Hearing Screening - Comments:: Patient denies any hearing difficulties.   Vision Screening - Comments:: Wears rx glasses - up to date with routine eye exams with  Dr. Mayo Robinson in [place]   Goals Addressed               This Visit's Progress     COMPLETED: Patient Stated (pt-stated)        Quit smoking       Patient Stated (pt-stated)        I want remain being a non smoker. I quit smoking on Nov 29, 2023 and I feel so much better.       Prevent falls   On track  COMPLETED: STOP SMOKING        Patient states has cut back on smoking. Currently smokes 4-5 packs per week       Depression Screen     05/18/2024    1:36 PM 01/16/2024    2:27 PM  12/12/2023    8:08 AM 06/13/2023    8:17 AM 05/13/2023    2:39 PM 12/12/2022    9:19 AM 06/06/2022    8:04 AM  PHQ 2/9 Scores  PHQ - 2 Score 0 0 0 0 1 1 1   PHQ- 9 Score 0 0 0   4     Fall Risk     05/14/2024    7:09 PM 01/16/2024    2:26 PM 12/12/2023    8:08 AM 07/09/2023    9:01 AM 06/13/2023    8:17 AM  Fall Risk   Falls in the past year? 0 0 0 0 0  Number falls in past yr: 0 0 0 0 0  Injury with Fall? 0 0 0 0 0  Risk for fall due to : No Fall Risks No Fall Risks No Fall Risks No Fall Risks   Follow up Falls evaluation completed;Education provided;Falls prevention discussed Falls evaluation completed Falls evaluation completed Falls evaluation completed     MEDICARE RISK AT HOME:  Medicare Risk at Home Any stairs in or around the home?: (Patient-Rptd) Yes If so, are there any without handrails?: (Patient-Rptd) No Home free of loose throw rugs in walkways, pet beds, electrical cords, etc?: (Patient-Rptd) Yes Adequate lighting in your home to reduce risk of falls?: (Patient-Rptd) Yes Life alert?: (Patient-Rptd) Yes Use of a cane, walker or w/c?: (Patient-Rptd) No Grab bars in the bathroom?: (Patient-Rptd) Yes Shower chair or bench in shower?: (Patient-Rptd) Yes Elevated toilet seat or a handicapped toilet?: (Patient-Rptd) Yes  TIMED UP AND GO:  Was the test performed?  No  Cognitive Function: 6CIT completed        05/18/2024    1:36 PM 05/13/2023    2:43 PM 04/27/2022    2:53 PM  6CIT Screen  What Year? 0 points 0 points 0 points  What month? 0 points 0 points 0 points  What time? 0 points 0 points 0 points  Count back from 20 0 points 0 points 0 points  Months in reverse 0 points 0 points 0 points  Repeat phrase 0 points 0 points 0 points  Total Score 0 points 0 points 0 points    Immunizations Immunization History  Administered Date(s) Administered   Fluad Quad(high Dose 65+) 07/29/2019, 07/20/2020, 07/04/2021   Fluad Trivalent(High Dose 65+) 06/13/2023    Influenza-Unspecified 07/14/2018, 07/29/2019, 07/04/2021, 07/04/2022   Moderna Sars-Covid-2 Vaccination 01/13/2021   PFIZER(Purple Top)SARS-COV-2 Vaccination 11/19/2019, 12/15/2019, 07/05/2020, 06/27/2021   Pneumococcal Conjugate-13 08/03/2015   Pneumococcal Polysaccharide-23 02/22/2020   Td 10/15/2014   Zoster Recombinant(Shingrix) 02/05/2022, 04/10/2022    Screening Tests Health Maintenance  Topic Date Due   COVID-19 Vaccine (6 - 2024-25 season) 06/16/2023   INFLUENZA VACCINE  05/15/2024   DTaP/Tdap/Td (2 - Tdap) 10/15/2024   Lung Cancer Screening  03/16/2025   Medicare Annual Wellness (AWV)  05/18/2025   Pneumococcal Vaccine: 50+ Years  Completed   Hepatitis C Screening  Completed   Zoster Vaccines- Shingrix  Completed   Hepatitis B Vaccines  Aged Out   HPV VACCINES  Aged Out   Meningococcal B Vaccine  Aged Out   Colonoscopy  Discontinued    Health Maintenance  Health Maintenance Due  Topic Date Due   COVID-19 Vaccine (6 - 2024-25 season) 06/16/2023   INFLUENZA VACCINE  05/15/2024   Health Maintenance Items Addressed: Routine health screenings are up to date. Patient is aware of recommended vaccines.   Additional Screening:  Vision Screening: Recommended annual ophthalmology exams for early detection of glaucoma and other disorders of the eye. Would you like a referral to an eye doctor? No    Dental Screening: Recommended annual dental exams for proper oral hygiene  Community Resource Referral / Chronic Care Management: CRR required this visit?  No   CCM required this visit?  No   Plan:    I have personally reviewed and noted the following in the patient's chart:   Medical and social history Use of alcohol, tobacco or illicit drugs  Current medications and supplements including opioid prescriptions. Patient is not currently taking opioid prescriptions. Functional ability and status Nutritional status Physical activity Advanced directives List of other  physicians Hospitalizations, surgeries, and ER visits in previous 12 months Vitals Screenings to include cognitive, depression, and falls Referrals and appointments  In addition, I have reviewed and discussed with patient certain preventive protocols, quality metrics, and best practice recommendations. A written personalized care plan for preventive services as well as general preventive health recommendations were provided to patient.   Sapna Padron, CMA   05/18/2024   After Visit Summary: (MyChart) Due to this being a telephonic visit, the after visit summary with patients personalized plan was offered to patient via MyChart   Notes: Nothing significant to report at this time.

## 2024-05-18 NOTE — Patient Instructions (Signed)
 David Rodriguez , Thank you for taking time out of your busy schedule to complete your Annual Wellness Visit with me. I enjoyed our conversation and look forward to speaking with you again next year. I, as well as your care team,  appreciate your ongoing commitment to your health goals. Please review the following plan we discussed and let me know if I can assist you in the future. Your Game plan/ To Do List    Referrals: If you haven't heard from the office you've been referred to, please reach out to them at the phone provided.  N/A  Follow up Visits: We will see or speak with you next year for your Next Medicare AWV with our clinical staff  Clinician Recommendations:  Aim for 30 minutes of exercise or brisk walking, 6-8 glasses of water , and 5 servings of fruits and vegetables each day.    Wishing you many blessings and good health during the next year until our next visit.  -Ciela Mahajan   This is a list of the screenings recommended for you:  Health Maintenance  Topic Date Due   COVID-19 Vaccine (6 - 2024-25 season) 06/16/2023   Flu Shot  05/15/2024   DTaP/Tdap/Td vaccine (2 - Tdap) 10/15/2024   Screening for Lung Cancer  03/16/2025   Medicare Annual Wellness Visit  05/18/2025   Pneumococcal Vaccine for age over 55  Completed   Hepatitis C Screening  Completed   Zoster (Shingles) Vaccine  Completed   Hepatitis B Vaccine  Aged Out   HPV Vaccine  Aged Out   Meningitis B Vaccine  Aged Out   Colon Cancer Screening  Discontinued    Advanced directives: (In Chart) A copy of your advanced directives are scanned into your chart should your provider ever need it. Advance Care Planning is important because it:  [x]  Makes sure you receive the medical care that is consistent with your values, goals, and preferences  [x]  It provides guidance to your family and loved ones and reduces their decisional burden about whether or not they are making the right decisions based on your wishes.  Follow the  link provided in your after visit summary or read over the paperwork we have mailed to you to help you started getting your Advance Directives in place. If you need assistance in completing these, please reach out to us  so that we can help you!  See attachments for Preventive Care and Fall Prevention Tips.

## 2024-06-16 ENCOUNTER — Encounter: Payer: Medicare Other | Admitting: Internal Medicine

## 2024-06-22 DIAGNOSIS — L723 Sebaceous cyst: Secondary | ICD-10-CM | POA: Diagnosis not present

## 2024-06-22 DIAGNOSIS — L570 Actinic keratosis: Secondary | ICD-10-CM | POA: Diagnosis not present

## 2024-06-24 DIAGNOSIS — R7303 Prediabetes: Secondary | ICD-10-CM | POA: Diagnosis not present

## 2024-06-25 LAB — CMP14+EGFR
ALT: 14 IU/L (ref 0–44)
AST: 15 IU/L (ref 0–40)
Albumin: 4.3 g/dL (ref 3.8–4.8)
Alkaline Phosphatase: 78 IU/L (ref 44–121)
BUN/Creatinine Ratio: 11 (ref 10–24)
BUN: 11 mg/dL (ref 8–27)
Bilirubin Total: 0.8 mg/dL (ref 0.0–1.2)
CO2: 23 mmol/L (ref 20–29)
Calcium: 9.4 mg/dL (ref 8.6–10.2)
Chloride: 102 mmol/L (ref 96–106)
Creatinine, Ser: 1 mg/dL (ref 0.76–1.27)
Globulin, Total: 2.5 g/dL (ref 1.5–4.5)
Glucose: 79 mg/dL (ref 70–99)
Potassium: 4.7 mmol/L (ref 3.5–5.2)
Sodium: 141 mmol/L (ref 134–144)
Total Protein: 6.8 g/dL (ref 6.0–8.5)
eGFR: 77 mL/min/1.73 (ref >59)

## 2024-06-25 LAB — HEMOGLOBIN A1C
Est. average glucose Bld gHb Est-mCnc: 120 mg/dL
Hgb A1c MFr Bld: 5.8 % — ABNORMAL HIGH (ref 4.8–5.6)

## 2024-06-26 ENCOUNTER — Encounter: Payer: Self-pay | Admitting: Internal Medicine

## 2024-06-26 ENCOUNTER — Ambulatory Visit (INDEPENDENT_AMBULATORY_CARE_PROVIDER_SITE_OTHER): Admitting: Internal Medicine

## 2024-06-26 VITALS — BP 131/79 | HR 78 | Ht 69.0 in | Wt 177.0 lb

## 2024-06-26 DIAGNOSIS — J432 Centrilobular emphysema: Secondary | ICD-10-CM | POA: Diagnosis not present

## 2024-06-26 DIAGNOSIS — Z0001 Encounter for general adult medical examination with abnormal findings: Secondary | ICD-10-CM

## 2024-06-26 DIAGNOSIS — I10 Essential (primary) hypertension: Secondary | ICD-10-CM | POA: Diagnosis not present

## 2024-06-26 DIAGNOSIS — R972 Elevated prostate specific antigen [PSA]: Secondary | ICD-10-CM | POA: Insufficient documentation

## 2024-06-26 DIAGNOSIS — E785 Hyperlipidemia, unspecified: Secondary | ICD-10-CM

## 2024-06-26 DIAGNOSIS — R7303 Prediabetes: Secondary | ICD-10-CM

## 2024-06-26 DIAGNOSIS — Z23 Encounter for immunization: Secondary | ICD-10-CM

## 2024-06-26 DIAGNOSIS — E559 Vitamin D deficiency, unspecified: Secondary | ICD-10-CM | POA: Diagnosis not present

## 2024-06-26 DIAGNOSIS — I5020 Unspecified systolic (congestive) heart failure: Secondary | ICD-10-CM | POA: Diagnosis not present

## 2024-06-26 MED ORDER — SPIKEVAX 50 MCG/0.5ML IM SUSY: 0.5000 mL | PREFILLED_SYRINGE | Freq: Once | INTRAMUSCULAR | 0 refills | Status: AC

## 2024-06-26 NOTE — Assessment & Plan Note (Signed)
 Lab Results  Component Value Date   HGBA1C 5.8 (H) 06/24/2024   Advised to follow low carb diet for now

## 2024-06-26 NOTE — Patient Instructions (Addendum)
 Please start taking Farxiga  as prescribed.  Please continue to take other medications as prescribed.  Please continue to follow low carb diet and perform moderate exercise/walking at least 150 mins/week.  Please get fasting blood tests done before the next visit.

## 2024-06-26 NOTE — Assessment & Plan Note (Addendum)
 Last echo reviewed - LVEF 40-45% Followed by cardiology On losartan  and metoprolol  Needs to start taking Farxiga  Has Lasix  as needed for leg swelling Appears euvolemic currently

## 2024-06-26 NOTE — Progress Notes (Signed)
 Established Patient Office Visit  Subjective:  Patient ID: David Rodriguez, male    DOB: 12/01/1945  Age: 78 y.o. MRN: [id number]  CC:  Chief Complaint  Patient presents with   Annual Exam    HPI Hoyt Leanos is a 78 y.o. male with past medical history of CAD, HTN, COPD and tobacco abuse who presents for f/u of his chronic medical conditions.  HTN: BP is well-controlled. Takes medications regularly. Patient denies headache, dizziness, chest pain, or palpitations.  HFrEF: He was evaluated by cardiology. He was placed on Farxiga , but he was scared to take it due to concern for kidney function decline.  After discussion today, he agrees to start Farxiga .  Of note, his dyspnea has improved since quitting smoking in 02/25.   COPD: Well-controlled with Anoro and PRN Albuterol . His low-dose CT chest showed emphysema and pulmonary nodules.  It also showed calcification of aortic valve and mitral annulus.  He has mild dyspnea, but reports that he feels better on the days he does not smoke.  Denies any orthopnea or PND currently.  He has history of AAA, s/p EVAR.  He denies any abdominal pain currently.  He also has popliteal artery aneurysm, but denies any claudication symptoms currently.  Denies any leg swelling currently.  He is followed by vascular surgery.  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   Hyperlipidemia    Hypertension    Screening for lung cancer 12/12/2022   Tobacco abuse 10/30/2016    Past Surgical History:  Procedure Laterality Date   ABDOMINAL AORTIC ANEURYSM REPAIR  2013   ABDOMINAL AORTIC ANEURYSM REPAIR  2013   CARDIAC CATHETERIZATION  2017   TONSILLECTOMY AND ADENOIDECTOMY  1953    Family History  Problem Relation Age of Onset   Hypertension Mother    Cancer Father        MESOTHELIOMA   Cancer Sister        LUNG   Cancer Brother        LUNG    Social  History   Socioeconomic History   Marital status: Widowed    Spouse name: [name]   Number of children: 3   Years of education: 13   Highest education level: Some college, no degree  Occupational History   Occupation: RETIRED    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  Tobacco Use   Smoking status: Former    Current packs/day: 0.00    Average packs/day: 0.7 packs/day for 75.0 years (50.0 ttl pk-yrs)    Types: Cigarettes    Start date: 08/27/1967    Quit date: 11/29/2023    Years since quitting: 0.5    Passive exposure: Never   Smokeless tobacco: Former   Tobacco comments:    Quit aftrr 50 plus years of smoking  Vaping Use   Vaping status: Never Used  Substance and Sexual Activity   Alcohol use: No   Drug use: Not Currently   Sexual activity: Yes  Other Topics Concern   Not on file  Social History Narrative   Moved from [place]    Retired Music therapist   Lives with [employer]   Social Drivers of Health   Financial Resource Strain: Low Risk  (06/22/2024)   Overall Financial Resource Strain (CARDIA)    Difficulty of Paying Living Expenses: Not hard at all  Food Insecurity: No Food Insecurity (06/22/2024)   Hunger Vital  Sign    Worried About Programme researcher, broadcasting/film/video in the Last Year: Never true    Ran Out of Food in the Last Year: Never true  Transportation Needs: No Transportation Needs (06/22/2024)   PRAPARE - Administrator, Civil Service (Medical): No    Lack of Transportation (Non-Medical): No  Physical Activity: Sufficiently Active (06/22/2024)   Exercise Vital Sign    Days of Exercise per Week: 5 days    Minutes of Exercise per Session: 30 min  Stress: No Stress Concern Present (06/22/2024)   [employer] of Occupational Health - Occupational Stress Questionnaire    Feeling of Stress: Not at all  Social Connections: Moderately Isolated (06/22/2024)   Social Connection and Isolation Panel    Frequency of Communication with Friends and Family: More than three times a week    Frequency of  Social Gatherings with Friends and Family: More than three times a week    Attends Religious Services: Never    Database administrator or Organizations: Yes    Attends Engineer, structural: More than 4 times per year    Marital Status: Widowed  Intimate Partner Violence: Not At Risk (05/18/2024)   Humiliation, Afraid, Rape, and Kick questionnaire    Fear of Current or Ex-Partner: No    Emotionally Abused: No    Physically Abused: No    Sexually Abused: No    Outpatient Medications Prior to Visit  Medication Sig Dispense Refill   albuterol  (VENTOLIN  HFA) 108 (90 Base) MCG/ACT inhaler INHALE 1 TO 2 PUFFS EVERY 6  HOURS AS NEEDED FOR SHORTNESS OF BREATH OR WHEEZING 17 g 6   ANORO ELLIPTA  62.5-25 MCG/ACT AEPB USE 1 INHALATION BY MOUTH DAILY  AT 6 AM 180 each 3   aspirin EC 81 MG tablet Take 81 mg by mouth every evening.     atorvastatin  (LIPITOR) 40 MG tablet TAKE 1 TABLET BY MOUTH DAILY (Patient taking differently: Take 40 mg by mouth every evening.) 90 tablet 3   Cholecalciferol (VITAMIN D -3) 125 MCG (5000 UT) TABS Take 5,000 Units by mouth every evening.     dapagliflozin  propanediol (FARXIGA ) 10 MG TABS tablet Take 1 tablet (10 mg total) by mouth daily before breakfast. 30 tablet 5   ezetimibe  (ZETIA ) 10 MG tablet Take 1 tablet (10 mg total) by mouth daily. 30 tablet 5   furosemide  (LASIX ) 20 MG tablet Take 1 tablet (20 mg total) by mouth daily as needed for fluid or edema (DOE). 30 tablet 5   losartan  (COZAAR ) 25 MG tablet Take 1 tablet (25 mg total) by mouth daily. 100 tablet 3   metoprolol  succinate (TOPROL  XL) 25 MG 24 hr tablet Take 1 tablet (25 mg total) by mouth daily. 90 tablet 3   sennosides-docusate sodium (SENOKOT-S) 8.6-50 MG tablet Take 1-2 tablets by mouth daily as needed for constipation.     benzonatate  (TESSALON ) 100 MG capsule Take 1 capsule (100 mg total) by mouth 3 (three) times daily as needed. 20 capsule 0   diphenhydrAMINE  (BENADRYL ) 50 MG tablet Take 1 tablet  1 hour prior to CT scan. (Patient not taking: Reported on 05/18/2024) 1 tablet 0   metoprolol  tartrate (LOPRESSOR ) 100 MG tablet Take 1 tablet by mouth 2 hours prior to CT. 1 tablet 0   ondansetron  (ZOFRAN ) 4 MG tablet Take 1 tablet (4 mg total) by mouth every 8 (eight) hours as needed. (Patient not taking: Reported on 05/18/2024) 30 tablet 1   predniSONE  (  DELTASONE ) 50 MG tablet Take 1 tablet 13 hours prior to CT scan, THEN take 1 tablet 7 hours prior to CT scan, THEN take 1 tablet 1 hour prior to CT scan. (Patient not taking: Reported on 05/18/2024) 3 tablet 1   No facility-administered medications prior to visit.    Allergies  Allergen Reactions   Iodine Hives    ROS Review of Systems  Constitutional:  Negative for chills and fever.  HENT:  Negative for congestion and sore throat.   Eyes:  Negative for pain and discharge.  Respiratory:  Positive for shortness of breath (Intermittent). Negative for cough.   Cardiovascular:  Negative for chest pain and palpitations.  Gastrointestinal:  Positive for constipation. Negative for diarrhea, nausea and vomiting.  Endocrine: Negative for polydipsia and polyuria.  Genitourinary:  Negative for dysuria and hematuria.  Musculoskeletal:  Negative for neck pain and neck stiffness.  Skin:  Negative for rash.  Neurological:  Negative for dizziness and weakness.  Psychiatric/Behavioral:  Negative for agitation and behavioral problems.       Objective:    Physical Exam Vitals reviewed.  Constitutional:      General: He is not in acute distress.    Appearance: He is not diaphoretic.  HENT:     Head: Normocephalic and atraumatic.     Nose: Nose normal.     Mouth/Throat:     Mouth: Mucous membranes are moist.  Eyes:     General: No scleral icterus.    Extraocular Movements: Extraocular movements intact.  Cardiovascular:     Rate and Rhythm: Normal rate and regular rhythm.     Heart sounds: Normal heart sounds. No murmur heard. Pulmonary:      Breath sounds: Normal breath sounds. No wheezing or rales.  Abdominal:     Palpations: Abdomen is soft.     Tenderness: There is no abdominal tenderness.  Musculoskeletal:     Cervical back: Neck supple. No tenderness.     Right lower leg: No edema.     Left lower leg: No edema.  Skin:    General: Skin is warm.     Findings: No rash.  Neurological:     General: No focal deficit present.     Mental Status: He is alert and oriented to person, place, and time.     Cranial Nerves: No cranial nerve deficit.     Sensory: No sensory deficit.     Motor: No weakness.  Psychiatric:        Mood and Affect: Mood normal.        Behavior: Behavior normal.     BP 131/79   Pulse 78   Ht 5' 9 (1.753 m)   Wt 177 lb (80.3 kg)   SpO2 93%   BMI 26.14 kg/m  Wt Readings from Last 3 Encounters:  06/26/24 177 lb (80.3 kg)  05/18/24 175 lb (79.4 kg)  03/11/24 174 lb 8 oz (79.2 kg)    Lab Results  Component Value Date   TSH 1.260 12/09/2023   Lab Results  Component Value Date   WBC 6.7 12/09/2023   HGB 15.8 12/09/2023   HCT 47.2 12/09/2023   MCV 91 12/09/2023   PLT 279 12/09/2023   Lab Results  Component Value Date   NA 141 06/24/2024   K 4.7 06/24/2024   CO2 23 06/24/2024   GLUCOSE 79 06/24/2024   BUN 11 06/24/2024   CREATININE 1.00 06/24/2024   BILITOT 0.8 06/24/2024   ALKPHOS 78 06/24/2024  AST 15 06/24/2024   ALT 14 06/24/2024   PROT 6.8 06/24/2024   ALBUMIN 4.3 06/24/2024   CALCIUM  9.4 06/24/2024   EGFR 77 06/24/2024   Lab Results  Component Value Date   CHOL 150 12/09/2023   Lab Results  Component Value Date   HDL 47 12/09/2023   Lab Results  Component Value Date   LDLCALC 85 12/09/2023   Lab Results  Component Value Date   TRIG 99 12/09/2023   Lab Results  Component Value Date   CHOLHDL 3.2 12/09/2023   Lab Results  Component Value Date   HGBA1C 5.8 (H) 06/24/2024      Assessment & Plan:   Problem List Items Addressed This Visit        Cardiovascular and Mediastinum   Essential hypertension (Chronic)   BP Readings from Last 1 Encounters:  06/26/24 131/79   Well-controlled with losartan  25 mg QD and metoprolol  25 mg QD Counseled for compliance with the medications Advised DASH diet and moderate exercise/walking, at least 150 mins/week      Relevant Orders   TSH   CMP14+EGFR   CBC with Differential/Platelet   HFrEF (heart failure with reduced ejection fraction) (HCC)   Last echo reviewed - LVEF 40-45% Followed by cardiology On losartan  and metoprolol  Needs to start taking Farxiga  Has Lasix  as needed for leg swelling Appears euvolemic currently      Relevant Orders   TSH   CMP14+EGFR   CBC with Differential/Platelet     Respiratory   COPD (chronic obstructive pulmonary disease) (HCC)   Usually has been well-controlled with Anoro and PRN albuterol  If recurrent COPD exacerbation, will switch to Trelegy Has home O2 for as needed use        Other   Hyperlipidemia (Chronic)   On atorvastatin  40 mg QD and Zetia  10 mg QD Check lipid profile      Relevant Orders   Lipid panel   Encounter for general adult medical examination with abnormal findings - Primary   Physical exam as documented. Fasting blood tests ordered. Up-to-date with pneumococcal and Shingrix vaccine.      Prediabetes   Lab Results  Component Value Date   HGBA1C 5.8 (H) 06/24/2024   Advised to follow low carb diet for now      Relevant Orders   Hemoglobin A1c   Elevated PSA   PSA was elevated at 4.1 in 08/24, but later improved Denies urinary symptoms currently Check PSA annually      Relevant Orders   PSA   Other Visit Diagnoses       Vitamin D  deficiency       Relevant Orders   VITAMIN D  25 Hydroxy (Vit-D Deficiency, Fractures)     Encounter for immunization       Relevant Orders   Flu vaccine HIGH DOSE PF(Fluzone Trivalent) (Completed)     Need for COVID-19 vaccine       Relevant Medications   COVID-19 mRNA  vaccine (SPIKEVAX ) syringe         Meds ordered this encounter  Medications   COVID-19 mRNA vaccine (SPIKEVAX ) syringe    Sig: Inject 0.5 mLs into the muscle once for 1 dose.    Dispense:  0.5 mL    Refill:  0    Follow-up: Return in about 6 months (around 12/24/2024) for HTN and COPD.    Suzzane MARLA Blanch, MD

## 2024-06-26 NOTE — Assessment & Plan Note (Addendum)
 BP Readings from Last 1 Encounters:  06/26/24 131/79   Well-controlled with losartan  25 mg QD and metoprolol  25 mg QD Counseled for compliance with the medications Advised DASH diet and moderate exercise/walking, at least 150 mins/week

## 2024-06-26 NOTE — Assessment & Plan Note (Signed)
Physical exam as documented. Fasting blood tests ordered. Up-to-date with pneumococcal and Shingrix vaccine.

## 2024-06-26 NOTE — Assessment & Plan Note (Addendum)
 On atorvastatin  40 mg QD and Zetia  10 mg QD Check lipid profile

## 2024-06-26 NOTE — Assessment & Plan Note (Signed)
 PSA was elevated at 4.1 in 08/24, but later improved Denies urinary symptoms currently Check PSA annually

## 2024-06-26 NOTE — Assessment & Plan Note (Signed)
 Usually has been well-controlled with Anoro and PRN albuterol  If recurrent COPD exacerbation, will switch to Trelegy Has home O2 for as needed use

## 2024-06-29 ENCOUNTER — Other Ambulatory Visit (HOSPITAL_COMMUNITY): Payer: Self-pay

## 2024-06-29 ENCOUNTER — Telehealth: Payer: Self-pay | Admitting: Pharmacy Technician

## 2024-06-29 NOTE — Telephone Encounter (Signed)
   But insurance prefers brand and pt has a Furniture conservator/restorer  I called [facility] [phone] and asked them to run brand on ins and hw grant and now this is being filled for free

## 2024-07-07 ENCOUNTER — Ambulatory Visit: Admitting: Internal Medicine

## 2024-07-20 ENCOUNTER — Ambulatory Visit: Admitting: Internal Medicine

## 2024-08-21 ENCOUNTER — Other Ambulatory Visit: Payer: Self-pay | Admitting: Internal Medicine

## 2024-08-24 ENCOUNTER — Encounter (INDEPENDENT_AMBULATORY_CARE_PROVIDER_SITE_OTHER): Payer: Self-pay

## 2024-09-05 ENCOUNTER — Other Ambulatory Visit: Payer: Self-pay | Admitting: Internal Medicine

## 2024-09-05 DIAGNOSIS — E785 Hyperlipidemia, unspecified: Secondary | ICD-10-CM

## 2024-09-17 ENCOUNTER — Other Ambulatory Visit: Payer: Self-pay | Admitting: Internal Medicine

## 2024-09-18 LAB — BASIC METABOLIC PANEL WITH GFR
BUN: 14 mg/dL (ref 7–25)
CO2: 27 mmol/L (ref 20–32)
Calcium: 9.2 mg/dL (ref 8.6–10.3)
Chloride: 103 mmol/L (ref 98–110)
Creat: 0.95 mg/dL (ref 0.70–1.22)
Glucose, Bld: 86 mg/dL (ref 65–99)
Potassium: 4.6 mmol/L (ref 3.5–5.3)
Sodium: 140 mmol/L (ref 135–146)
eGFR: 81 mL/min/1.73m2 (ref >=60)

## 2024-09-21 ENCOUNTER — Telehealth: Payer: Self-pay | Admitting: Internal Medicine

## 2024-09-21 ENCOUNTER — Telehealth: Admitting: Physician Assistant

## 2024-09-21 DIAGNOSIS — J441 Chronic obstructive pulmonary disease with (acute) exacerbation: Secondary | ICD-10-CM

## 2024-09-21 MED ORDER — DOXYCYCLINE HYCLATE 100 MG PO TABS: 100.0000 mg | ORAL_TABLET | Freq: Two times a day (BID) | ORAL | 0 refills | Status: AC

## 2024-09-21 MED ORDER — PREDNISONE 20 MG PO TABS: 40.0000 mg | ORAL_TABLET | Freq: Every day | ORAL | 0 refills | Status: AC

## 2024-09-21 MED ORDER — BENZONATATE 100 MG PO CAPS: 100.0000 mg | ORAL_CAPSULE | Freq: Three times a day (TID) | ORAL | 0 refills | Status: AC | PRN

## 2024-09-21 NOTE — Telephone Encounter (Signed)
 Pt called in to cancel his app 12/9 due to being sick. He requested a messages be sent back to see if the nurse can go over results on the phone and to see if he still needs to come in at another date. Please advise.

## 2024-09-21 NOTE — Patient Instructions (Signed)
 David Rodriguez, thank you for joining David Velma Lunger, PA-C for today's virtual visit.  While this provider is not your primary care provider (PCP), if your PCP is located in our provider database this encounter information will be shared with them immediately following your visit.   A [facility] MyChart account gives you access to today's visit and all your visits, tests, and labs performed at [facility]  click here if you don't have a [facility] MyChart account or go to [email or web address]  Consent: (Patient) David Rodriguez provided verbal consent for this virtual visit at the beginning of the encounter.  Current Medications:  Current Outpatient Medications:    benzonatate  (TESSALON ) 100 MG capsule, Take 1 capsule (100 mg total) by mouth 3 (three) times daily as needed for cough., Disp: 30 capsule, Rfl: 0   doxycycline  (VIBRA -TABS) 100 MG tablet, Take 1 tablet (100 mg total) by mouth 2 (two) times daily., Disp: 14 tablet, Rfl: 0   predniSONE  (DELTASONE ) 20 MG tablet, Take 2 tablets (40 mg total) by mouth daily with breakfast., Disp: 10 tablet, Rfl: 0   albuterol  (VENTOLIN  HFA) 108 (90 Base) MCG/ACT inhaler, INHALE 1 TO 2 PUFFS EVERY 6  HOURS AS NEEDED FOR SHORTNESS OF BREATH OR WHEEZING, Disp: 17 g, Rfl: 6   ANORO ELLIPTA  62.5-25 MCG/ACT AEPB, USE 1 INHALATION BY MOUTH DAILY  AT 6 AM, Disp: 180 each, Rfl: 3   aspirin EC 81 MG tablet, Take 81 mg by mouth every evening., Disp: , Rfl:    atorvastatin  (LIPITOR) 40 MG tablet, TAKE 1 TABLET BY MOUTH DAILY, Disp: 100 tablet, Rfl: 2   Cholecalciferol (VITAMIN D -3) 125 MCG (5000 UT) TABS, Take 5,000 Units by mouth every evening., Disp: , Rfl:    dapagliflozin  propanediol (FARXIGA ) 10 MG TABS tablet, Take 1 tablet (10 mg total) by mouth daily before breakfast., Disp: 30 tablet, Rfl: 5   ezetimibe  (ZETIA ) 10 MG tablet, Take 1 tablet (10 mg total) by mouth daily., Disp: 30 tablet, Rfl: 5   furosemide  (LASIX ) 20 MG tablet, Take 1  tablet (20 mg total) by mouth daily as needed for fluid or edema (DOE)., Disp: 30 tablet, Rfl: 5   losartan  (COZAAR ) 25 MG tablet, Take 1 tablet (25 mg total) by mouth daily., Disp: 100 tablet, Rfl: 3   metoprolol  succinate (TOPROL  XL) 25 MG 24 hr tablet, Take 1 tablet (25 mg total) by mouth daily., Disp: 90 tablet, Rfl: 3   sennosides-docusate sodium (SENOKOT-S) 8.6-50 MG tablet, Take 1-2 tablets by mouth daily as needed for constipation., Disp: , Rfl:    Medications ordered in this encounter:  Meds ordered this encounter  Medications   benzonatate  (TESSALON ) 100 MG capsule    Sig: Take 1 capsule (100 mg total) by mouth 3 (three) times daily as needed for cough.    Dispense:  30 capsule    Refill:  0    Supervising Provider:   LAMPTEY, PHILIP O [[id number]]   predniSONE  (DELTASONE ) 20 MG tablet    Sig: Take 2 tablets (40 mg total) by mouth daily with breakfast.    Dispense:  10 tablet    Refill:  0    Supervising Provider:   BLAISE ALEENE KIDD [[id number]]   doxycycline  (VIBRA -TABS) 100 MG tablet    Sig: Take 1 tablet (100 mg total) by mouth 2 (two) times daily.    Dispense:  14 tablet    Refill:  0    Supervising Provider:   BLAISE ALEENE KIDD [phone]     *  If you need refills on other medications prior to your next appointment, please contact your pharmacy*  Follow-Up: Call back or seek an in-person evaluation if the symptoms worsen or if the condition fails to improve as anticipated.  [facility] [phone]  Other Instructions Please hydrate and rest. Okay to use plain Mucinex over-the-counter to thin out chest congestion. Keep a close watch on your oxygen level.  If this is getting below 90% or you notice any increased shortness of breath, you need to be evaluated in person ASAP. Otherwise take the prednisone  as directed. If you note any change in what you are producing with your cough, start the antibiotic I placed on file at the pharmacy, taking as directed. If  you note any non-resolving, new, or worsening symptoms despite treatment, please seek an in-person evaluation ASAP.    If you have been instructed to have an in-person evaluation today at a local Urgent Care facility, please use the link below. It will take you to a list of all of our available [facility], including address, phone number and hours of operation. Please do not delay care.  [facility]  If you or a family member do not have a primary care provider, use the link below to schedule a visit and establish care. When you choose a [facility] primary care physician or advanced practice provider, you gain a long-term partner in health. Find a Primary Care Provider  Learn more about [facility]'s in-office and virtual care options: [facility] Get Care Now

## 2024-09-21 NOTE — Telephone Encounter (Signed)
 Patient has completed lab work wanting to know results. Also if he needs to reschedule his appointment. Please Advise

## 2024-09-21 NOTE — Progress Notes (Signed)
 Virtual Visit Consent   David Rodriguez, you are scheduled for a virtual visit with a [facility] provider today. Just as with appointments in the office, your consent must be obtained to participate. Your consent will be active for this visit and any virtual visit you may have with one of our providers in the next 365 days. If you have a MyChart account, a copy of this consent can be sent to you electronically.  As this is a virtual visit, video technology does not allow for your provider to perform a traditional examination. This may limit your provider's ability to fully assess your condition. If your provider identifies any concerns that need to be evaluated in person or the need to arrange testing (such as labs, EKG, etc.), we will make arrangements to do so. Although advances in technology are sophisticated, we cannot ensure that it will always work on either your end or our end. If the connection with a video visit is poor, the visit may have to be switched to a telephone visit. With either a video or telephone visit, we are not always able to ensure that we have a secure connection.  By engaging in this virtual visit, you consent to the provision of healthcare and authorize for your insurance to be billed (if applicable) for the services provided during this visit. Depending on your insurance coverage, you may receive a charge related to this service.  I need to obtain your verbal consent now. Are you willing to proceed with your visit today? David Rodriguez has provided verbal consent on 09/21/2024 for a virtual visit (video or telephone). David Rodriguez, [place]  Date: 09/21/2024 8:44 AM   Virtual Visit via Video Note   I, David Rodriguez, connected with  David Rodriguez  ([id number], 06-Apr-1946) on 09/21/24 at  8:15 AM EST by a video-enabled telemedicine application and verified that I am speaking with the correct person using two identifiers.  Location: Patient: Virtual Visit Location  Patient: Home Provider: Virtual Visit Location Provider: Home Office   I discussed the limitations of evaluation and management by telemedicine and the availability of in person appointments. The patient expressed understanding and agreed to proceed.    History of Present Illness: David Rodriguez is a 78 y.o. who identifies as a male who was assigned male at birth, and is being seen today for illness starting Saturday night.  Notes he started feeling unwell with a low-grade fever of 100.9.  He notes the next day the fever had dissipated but started with cough and chest congestion, coughing so much he noted some right sided chest wall tenderness.  Has a history of COPD and noting increased mucus production, chest tightness and wheezing.  Oxygen has been staying in the 90s, but he did increase his use of as needed home oxygen as a precaution.  Also noting a mild stomachache and anorexia without vomiting or diarrhea.  Took a home COVID test as a precaution which was negative.  Notes he is staying well-hydrated  OTC -- Equate brand cough and flu.    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   Encounter for general adult medical examination with abnormal findings 06/06/2022   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   Essential hypertension 07/04/2021   Bilateral inguinal hernia without obstruction or gangrene 03/08/2020   Abnormality of lung on CXR 12/26/2017   COPD (chronic obstructive  pulmonary disease) (HCC) 12/25/2017   CAD in native artery 11/02/2016   Tobacco abuse 10/30/2016   History of colonic polyps 10/30/2016   AAA (abdominal aortic aneurysm) without rupture 10/30/2016   Hyperlipidemia 10/30/2016   Immunization refused 10/30/2016    Allergies:  Allergies  Allergen Reactions   Iodine Hives   Medications:  Current Outpatient Medications:    benzonatate  (TESSALON ) 100 MG capsule, Take 1 capsule (100 mg total) by mouth 3 (three) times daily as needed for cough., Disp: 30 capsule, Rfl: 0   doxycycline  (VIBRA -TABS) 100 MG tablet, Take 1 tablet (100 mg total) by mouth 2 (two) times daily., Disp: 14 tablet, Rfl: 0   predniSONE  (DELTASONE ) 20 MG tablet, Take 2 tablets (40 mg total) by mouth daily with breakfast., Disp: 10 tablet, Rfl: 0   albuterol  (VENTOLIN  HFA) 108 (90 Base) MCG/ACT inhaler, INHALE 1 TO 2 PUFFS EVERY 6  HOURS AS NEEDED FOR SHORTNESS OF BREATH OR WHEEZING, Disp: 17 g, Rfl: 6   ANORO ELLIPTA  62.5-25 MCG/ACT AEPB, USE 1 INHALATION BY MOUTH DAILY  AT 6 AM, Disp: 180 each, Rfl: 3   aspirin EC 81 MG tablet, Take 81 mg by mouth every evening., Disp: , Rfl:    atorvastatin  (LIPITOR) 40 MG tablet, TAKE 1 TABLET BY MOUTH DAILY, Disp: 100 tablet, Rfl: 2   Cholecalciferol (VITAMIN D -3) 125 MCG (5000 UT) TABS, Take 5,000 Units by mouth every evening., Disp: , Rfl:    dapagliflozin  propanediol (FARXIGA ) 10 MG TABS tablet, Take 1 tablet (10 mg total) by mouth daily before breakfast., Disp: 30 tablet, Rfl: 5   ezetimibe  (ZETIA ) 10 MG tablet, Take 1 tablet (10 mg total) by mouth daily., Disp: 30 tablet, Rfl: 5   furosemide  (LASIX ) 20 MG tablet, Take 1 tablet (20 mg total) by mouth daily as needed for fluid or edema (DOE)., Disp: 30 tablet, Rfl: 5   losartan  (COZAAR ) 25 MG tablet, Take 1 tablet (25 mg total) by mouth daily., Disp: 100 tablet, Rfl: 3   metoprolol  succinate (TOPROL  XL) 25 MG 24 hr tablet, Take 1 tablet (25 mg total) by mouth daily., Disp: 90 tablet, Rfl:  3   sennosides-docusate sodium (SENOKOT-S) 8.6-50 MG tablet, Take 1-2 tablets by mouth daily as needed for constipation., Disp: , Rfl:   Observations/Objective: Patient is well-developed, well-nourished in no acute distress.  Resting comfortably  at home.  Head is normocephalic, atraumatic.  No labored breathing.  Speech is clear and coherent with logical content.  Patient is alert and oriented at baseline.   Assessment and Plan: 1. COPD exacerbation (HCC) (Primary) - benzonatate  (TESSALON ) 100 MG capsule; Take 1 capsule (100 mg total) by mouth 3 (three) times daily as needed for cough.  Dispense: 30 capsule; Refill: 0 - predniSONE  (DELTASONE ) 20 MG tablet; Take 2 tablets (40 mg total) by mouth daily with breakfast.  Dispense: 10 tablet; Refill: 0  Suspect viral illness triggering COPD exacerbation.  Supportive measures and OTC medications reviewed.  Start prednisone  40 mg for 5 days and Tessalon  for cough.  Continue home oxygen and check of oxygen levels.  He  is to seek an in person evaluation for any oxygen levels below 90%, or any further increased use of home oxygen.  Did place a prescription on file for doxycycline  in case of any change in sputum production or recurrence of fever.  Strict in person evaluation precautions reviewed with patient.  Follow Up Instructions: I discussed the assessment and treatment plan with the patient. The patient was provided an opportunity to ask questions and all were answered. The patient agreed with the plan and demonstrated an understanding of the instructions.  A copy of instructions were sent to the patient via MyChart unless otherwise noted below.   The patient was advised to call back or seek an in-person evaluation if the symptoms worsen or if the condition fails to improve as anticipated.    David Velma Lunger, PA-C

## 2024-09-22 ENCOUNTER — Ambulatory Visit: Admitting: Internal Medicine

## 2024-09-23 NOTE — Telephone Encounter (Signed)
 Per Dr. Stacia: BMP normal. Okay to schedule telephone visit tomorrow or in the next 1-2 weeks to re-evaluate symptoms (chest pain or DOE) and schedule him for [facility] if needed,   Advised patient of recommendations and he verbalized understanding. Advised him should receive a call to from our schedulers to schedule that telephone visit

## 2024-09-25 NOTE — Telephone Encounter (Signed)
 Spoke with patient regarding an appointment.  See notes from Dr. Stacia dated 09-23-24 David Rodriguez was under the impression that he didn't need to see Dr.Mallipeddi. He was informed that we would reach back out to Dr.Mallipeddi .

## 2024-11-04 ENCOUNTER — Ambulatory Visit: Admitting: Physician Assistant

## 2024-11-17 ENCOUNTER — Ambulatory Visit: Admitting: Physician Assistant

## 2024-12-25 ENCOUNTER — Ambulatory Visit: Admitting: Internal Medicine

## 2025-05-19 ENCOUNTER — Ambulatory Visit

## 2025-05-21 ENCOUNTER — Ambulatory Visit
